# Patient Record
Sex: Female | Born: 1950 | Race: White | Hispanic: No | Marital: Married | Smoking: Current every day smoker
Health system: Southern US, Community
[De-identification: ages and names within clinical notes are randomized; demographics above are authoritative.]

## PROBLEM LIST (undated history)

## (undated) DIAGNOSIS — F329 Major depressive disorder, single episode, unspecified: Secondary | ICD-10-CM

## (undated) DIAGNOSIS — D649 Anemia, unspecified: Secondary | ICD-10-CM

## (undated) DIAGNOSIS — M545 Low back pain, unspecified: Secondary | ICD-10-CM

## (undated) DIAGNOSIS — K219 Gastro-esophageal reflux disease without esophagitis: Secondary | ICD-10-CM

## (undated) DIAGNOSIS — E042 Nontoxic multinodular goiter: Secondary | ICD-10-CM

## (undated) DIAGNOSIS — I728 Aneurysm of other specified arteries: Secondary | ICD-10-CM

## (undated) DIAGNOSIS — J449 Chronic obstructive pulmonary disease, unspecified: Secondary | ICD-10-CM

## (undated) DIAGNOSIS — I10 Essential (primary) hypertension: Secondary | ICD-10-CM

## (undated) DIAGNOSIS — F32A Depression, unspecified: Secondary | ICD-10-CM

## (undated) DIAGNOSIS — S72009A Fracture of unspecified part of neck of unspecified femur, initial encounter for closed fracture: Secondary | ICD-10-CM

## (undated) DIAGNOSIS — Z87442 Personal history of urinary calculi: Secondary | ICD-10-CM

## (undated) DIAGNOSIS — K759 Inflammatory liver disease, unspecified: Secondary | ICD-10-CM

## (undated) DIAGNOSIS — S129XXA Fracture of neck, unspecified, initial encounter: Secondary | ICD-10-CM

## (undated) DIAGNOSIS — M109 Gout, unspecified: Secondary | ICD-10-CM

## (undated) DIAGNOSIS — Z8719 Personal history of other diseases of the digestive system: Secondary | ICD-10-CM

## (undated) DIAGNOSIS — C801 Malignant (primary) neoplasm, unspecified: Secondary | ICD-10-CM

## (undated) DIAGNOSIS — F419 Anxiety disorder, unspecified: Secondary | ICD-10-CM

## (undated) DIAGNOSIS — K769 Liver disease, unspecified: Secondary | ICD-10-CM

## (undated) DIAGNOSIS — K2970 Gastritis, unspecified, without bleeding: Secondary | ICD-10-CM

## (undated) DIAGNOSIS — M549 Dorsalgia, unspecified: Secondary | ICD-10-CM

## (undated) DIAGNOSIS — K589 Irritable bowel syndrome without diarrhea: Secondary | ICD-10-CM

## (undated) HISTORY — PX: HERNIA REPAIR: SHX51

## (undated) HISTORY — PX: THYROID SURGERY: SHX805

## (undated) HISTORY — PX: BREAST CYST ASPIRATION: SHX578

## (undated) HISTORY — PX: CHOLECYSTECTOMY: SHX55

## (undated) HISTORY — PX: CARPAL TUNNEL RELEASE: SHX101

## (undated) HISTORY — PX: TONSILLECTOMY: SUR1361

## (undated) HISTORY — PX: APPENDECTOMY: SHX54

## (undated) HISTORY — PX: TUBAL LIGATION: SHX77

---
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
## 1976-10-19 HISTORY — PX: CHOLECYSTECTOMY: SHX55

## 2004-10-29 ENCOUNTER — Ambulatory Visit: Payer: Self-pay | Admitting: Unknown Physician Specialty

## 2005-03-30 ENCOUNTER — Ambulatory Visit: Payer: Self-pay | Admitting: General Practice

## 2005-04-08 ENCOUNTER — Ambulatory Visit: Payer: Self-pay | Admitting: General Practice

## 2006-03-30 ENCOUNTER — Ambulatory Visit: Payer: Self-pay | Admitting: General Practice

## 2007-04-05 ENCOUNTER — Ambulatory Visit: Payer: Self-pay | Admitting: Endocrinology

## 2008-01-13 ENCOUNTER — Ambulatory Visit: Payer: Self-pay | Admitting: Unknown Physician Specialty

## 2008-02-09 ENCOUNTER — Ambulatory Visit: Payer: Self-pay | Admitting: Unknown Physician Specialty

## 2008-05-30 ENCOUNTER — Ambulatory Visit: Payer: Self-pay | Admitting: Endocrinology

## 2008-08-16 ENCOUNTER — Ambulatory Visit: Payer: Self-pay | Admitting: Internal Medicine

## 2008-09-20 ENCOUNTER — Emergency Department: Payer: Self-pay | Admitting: Unknown Physician Specialty

## 2009-01-26 ENCOUNTER — Emergency Department: Payer: Self-pay | Admitting: Emergency Medicine

## 2009-02-22 ENCOUNTER — Ambulatory Visit: Payer: Self-pay

## 2010-02-19 ENCOUNTER — Ambulatory Visit: Payer: Self-pay | Admitting: Internal Medicine

## 2011-10-20 DIAGNOSIS — K759 Inflammatory liver disease, unspecified: Secondary | ICD-10-CM

## 2011-10-20 HISTORY — DX: Inflammatory liver disease, unspecified: K75.9

## 2011-11-01 ENCOUNTER — Ambulatory Visit: Payer: Self-pay

## 2011-11-19 ENCOUNTER — Ambulatory Visit: Payer: Self-pay | Admitting: Gastroenterology

## 2011-12-01 ENCOUNTER — Ambulatory Visit: Payer: Self-pay | Admitting: Gastroenterology

## 2011-12-01 LAB — PLATELET COUNT: Platelet: 267 10*3/uL (ref 150–440)

## 2011-12-01 LAB — PROTIME-INR: INR: 0.9

## 2011-12-03 LAB — PATHOLOGY REPORT

## 2012-01-04 ENCOUNTER — Ambulatory Visit: Payer: Self-pay

## 2012-01-20 ENCOUNTER — Other Ambulatory Visit: Payer: Self-pay | Admitting: Gastroenterology

## 2012-01-21 LAB — CLOSTRIDIUM DIFFICILE BY PCR

## 2012-01-22 LAB — STOOL CULTURE

## 2012-03-22 ENCOUNTER — Ambulatory Visit: Payer: Self-pay | Admitting: Internal Medicine

## 2012-03-22 LAB — CBC CANCER CENTER
HGB: 8.9 g/dL — ABNORMAL LOW (ref 12.0–16.0)
MCHC: 33.2 g/dL (ref 32.0–36.0)
Monocytes: 5 %
Platelet: 112 x10 3/mm — ABNORMAL LOW (ref 150–440)
Segmented Neutrophils: 73 %

## 2012-03-22 LAB — IRON AND TIBC
Iron Bind.Cap.(Total): 397 ug/dL (ref 250–450)
Iron Saturation: 39 %

## 2012-03-22 LAB — FOLATE: Folic Acid: 9.4 ng/mL (ref 3.1–100.0)

## 2012-03-22 LAB — RETICULOCYTES: Absolute Retic Count: 0.0474 10*6/uL (ref 0.024–0.084)

## 2012-03-22 LAB — FERRITIN: Ferritin (ARMC): 569 ng/mL — ABNORMAL HIGH (ref 8–388)

## 2012-03-23 LAB — URINE IEP, RANDOM

## 2012-03-23 LAB — PROT IMMUNOELECTROPHORES(ARMC)

## 2012-03-24 LAB — OCCULT BLOOD X 1 CARD TO LAB, STOOL: Occult Blood, Feces: NEGATIVE

## 2012-04-07 LAB — CBC CANCER CENTER
Basophil #: 0 x10 3/mm (ref 0.0–0.1)
Basophil %: 0.4 %
Eosinophil #: 0 x10 3/mm (ref 0.0–0.7)
HCT: 20.6 % — ABNORMAL LOW (ref 35.0–47.0)
HGB: 6.9 g/dL — ABNORMAL LOW (ref 12.0–16.0)
Lymphocyte #: 0.6 x10 3/mm — ABNORMAL LOW (ref 1.0–3.6)
Lymphocyte %: 30.6 %
MCV: 102 fL — ABNORMAL HIGH (ref 80–100)
Monocyte #: 0.2 x10 3/mm (ref 0.2–0.9)
Neutrophil #: 1.3 x10 3/mm — ABNORMAL LOW (ref 1.4–6.5)
Neutrophil %: 60.3 %
RBC: 2.02 10*6/uL — ABNORMAL LOW (ref 3.80–5.20)
RDW: 18.1 % — ABNORMAL HIGH (ref 11.5–14.5)

## 2012-04-08 ENCOUNTER — Ambulatory Visit: Payer: Self-pay | Admitting: Oncology

## 2012-04-11 LAB — CBC CANCER CENTER
Basophil #: 0 x10 3/mm (ref 0.0–0.1)
Eosinophil #: 0 x10 3/mm (ref 0.0–0.7)
HCT: 26.6 % — ABNORMAL LOW (ref 35.0–47.0)
Lymphocyte #: 0.6 x10 3/mm — ABNORMAL LOW (ref 1.0–3.6)
Lymphocyte %: 27.3 %
MCH: 34.2 pg — ABNORMAL HIGH (ref 26.0–34.0)
MCV: 102 fL — ABNORMAL HIGH (ref 80–100)
Monocyte #: 0.2 x10 3/mm (ref 0.2–0.9)
Monocyte %: 11.2 %
Neutrophil %: 60.5 %
Platelet: 102 x10 3/mm — ABNORMAL LOW (ref 150–440)
RDW: 17.3 % — ABNORMAL HIGH (ref 11.5–14.5)
WBC: 2.1 x10 3/mm — ABNORMAL LOW (ref 3.6–11.0)

## 2012-04-14 LAB — CBC CANCER CENTER
Basophil #: 0 x10 3/mm (ref 0.0–0.1)
Basophil %: 0.6 %
Eosinophil #: 0 x10 3/mm (ref 0.0–0.7)
Eosinophil %: 0.5 %
Lymphocyte #: 0.7 x10 3/mm — ABNORMAL LOW (ref 1.0–3.6)
Lymphocyte %: 26.2 %
MCHC: 33.4 g/dL (ref 32.0–36.0)
MCV: 102 fL — ABNORMAL HIGH (ref 80–100)
Monocyte #: 0.3 x10 3/mm (ref 0.2–0.9)
Neutrophil #: 1.5 x10 3/mm (ref 1.4–6.5)
Platelet: 121 x10 3/mm — ABNORMAL LOW (ref 150–440)
RBC: 2.63 10*6/uL — ABNORMAL LOW (ref 3.80–5.20)
RDW: 17.3 % — ABNORMAL HIGH (ref 11.5–14.5)

## 2012-04-18 ENCOUNTER — Ambulatory Visit: Payer: Self-pay | Admitting: Internal Medicine

## 2012-05-19 ENCOUNTER — Ambulatory Visit: Payer: Self-pay | Admitting: Internal Medicine

## 2012-06-19 ENCOUNTER — Ambulatory Visit: Payer: Self-pay | Admitting: Internal Medicine

## 2012-11-04 ENCOUNTER — Ambulatory Visit: Payer: Self-pay | Admitting: Gastroenterology

## 2012-11-09 ENCOUNTER — Ambulatory Visit: Payer: Self-pay | Admitting: Gastroenterology

## 2012-11-11 LAB — PATHOLOGY REPORT

## 2013-10-19 HISTORY — PX: THYROID SURGERY: SHX805

## 2013-12-19 ENCOUNTER — Ambulatory Visit: Payer: Self-pay | Admitting: Surgery

## 2013-12-28 ENCOUNTER — Other Ambulatory Visit: Payer: Self-pay | Admitting: Physician Assistant

## 2013-12-28 ENCOUNTER — Ambulatory Visit: Payer: Self-pay | Admitting: Surgery

## 2013-12-28 LAB — CBC WITH DIFFERENTIAL/PLATELET
Basophil #: 0.1 10*3/uL (ref 0.0–0.1)
Basophil %: 0.8 %
EOS ABS: 0.1 10*3/uL (ref 0.0–0.7)
Eosinophil %: 1.3 %
HCT: 43.1 % (ref 35.0–47.0)
HGB: 15.1 g/dL (ref 12.0–16.0)
LYMPHS PCT: 21.4 %
Lymphocyte #: 1.7 10*3/uL (ref 1.0–3.6)
MCH: 32.6 pg (ref 26.0–34.0)
MCHC: 34.9 g/dL (ref 32.0–36.0)
MCV: 93 fL (ref 80–100)
Monocyte #: 0.6 x10 3/mm (ref 0.2–0.9)
Monocyte %: 7.1 %
NEUTROS ABS: 5.7 10*3/uL (ref 1.4–6.5)
Neutrophil %: 69.4 %
Platelet: 293 10*3/uL (ref 150–440)
RBC: 4.62 10*6/uL (ref 3.80–5.20)
RDW: 13.8 % (ref 11.5–14.5)
WBC: 8.2 10*3/uL (ref 3.6–11.0)

## 2013-12-28 LAB — PROTIME-INR
INR: 1
PROTHROMBIN TIME: 13.3 s (ref 11.5–14.7)

## 2013-12-28 LAB — TSH: THYROID STIMULATING HORM: 0.772 u[IU]/mL

## 2013-12-28 LAB — BASIC METABOLIC PANEL
Anion Gap: 1 — ABNORMAL LOW (ref 7–16)
BUN: 10 mg/dL (ref 7–18)
Calcium, Total: 9.2 mg/dL (ref 8.5–10.1)
Chloride: 109 mmol/L — ABNORMAL HIGH (ref 98–107)
Co2: 28 mmol/L (ref 21–32)
Creatinine: 0.84 mg/dL (ref 0.60–1.30)
GLUCOSE: 84 mg/dL (ref 65–99)
Osmolality: 274 (ref 275–301)
Potassium: 3.8 mmol/L (ref 3.5–5.1)
SODIUM: 138 mmol/L (ref 136–145)

## 2013-12-28 LAB — LIPID PANEL
CHOLESTEROL: 167 mg/dL (ref 0–200)
HDL Cholesterol: 34 mg/dL — ABNORMAL LOW (ref 40–60)
LDL CHOLESTEROL, CALC: 101 mg/dL — AB (ref 0–100)
Triglycerides: 158 mg/dL (ref 0–200)
VLDL Cholesterol, Calc: 32 mg/dL (ref 5–40)

## 2013-12-28 LAB — APTT: ACTIVATED PTT: 37.7 s — AB (ref 23.6–35.9)

## 2014-01-05 ENCOUNTER — Ambulatory Visit: Payer: Self-pay | Admitting: Surgery

## 2014-01-16 LAB — PATHOLOGY REPORT

## 2014-04-04 ENCOUNTER — Ambulatory Visit: Payer: Self-pay | Admitting: Physician Assistant

## 2014-07-20 ENCOUNTER — Ambulatory Visit: Payer: Self-pay

## 2015-02-09 NOTE — Op Note (Signed)
PATIENT NAME:  Tina Bradley, Tina Bradley MR#:  [id number] DATE OF BIRTH:  06/12/1951  DATE OF PROCEDURE:  01/05/2014  PREOPERATIVE DIAGNOSIS: Multiple colloid nodules, right lobe thyroid gland, with dysphagia.   POSTOPERATIVE  DIAGNOSIS: Multiple colloid nodules, right lobe thyroid gland, with dysphagia.   PROCEDURE: Right thyroid lobectomy.   SURGEON: Loreli Dollar, MD  ANESTHESIA: General.   INDICATIONS: This 64 year old female has a complaint of difficulty swallowing. She has had endocrinology evaluation and has had findings of 3 hypoechoic nodules of the right lobe thyroid gland. Biopsy demonstrated colloid nodule. Surgery was recommended for definitive treatment.   DESCRIPTION OF PROCEDURE: The patient was placed on the operating table in the supine position under general endotracheal anesthesia. The rolled sheet was placed behind the shoulder blades so that the neck was extended. The neck and surrounding chest wall were prepared with ChloraPrep and draped in a sterile manner.   A transversely oriented curvilinear incision was made, carried down through subcutaneous tissues. Several small bleeding points were cauterized. The platysma was incised with electrocautery. The midline was identified. The small incision was made in the midline fascia and dissected down to identify the strap muscles. The sternocleidomastoid was separated from the strap muscles laterally. The strap muscles on the right side were divided with the Harmonic scalpel as they were dissected off the underlying thyroid lobe. Also, just about 1 cm width of strap muscles was divided on the left side adjacent to the midline. Next, the strap muscles were dissected away from the thyroid towards the superior pole and also inferiorly. A Weitlaner retractor was used. The thyroid was grasped with a hemostat for traction. Next, with further dissection, the thyroid was mobilized. The superior pole vessels were dissected circumferentially and were  ligated with 3-0 Vicryl, and then the superior pole vessels were divided with the Harmonic scalpel. Subsequently, the inferior thyroidal artery was divided with the Harmonic scalpel. The thyroid was further mobilized. Identified right inferior parathyroid gland which appeared to be normal size. Also, the location of the recurrent laryngeal nerve was demonstrated. Further dissection was carried out and noted the presence of the right superior parathyroid gland which appeared normal size. The gland was further dissected with a combination of blunt dissection and use of Harmonic scalpel, and divided the thyroid at the isthmus and was submitted in formalin for routine pathology. The wound was inspected, and hemostasis appeared to be intact. The patient was momentarily placed in the reverse Trendelenburg position, and there was no bleeding, then returned to reverse Trendelenburg position, and after a brief period of observation, seeing hemostasis was intact, the strap muscles were repaired with interrupted 4-0 Vicryl simple and figure-of-eight sutures. Next, the platysma was closed with interrupted 5-0 Monocryl, and the skin was closed with running 5-0 Monocryl subcuticular suture and Dermabond. The patient tolerated surgery satisfactorily and was then prepared for transfer to the recovery room.  ____________________________ Lenna Sciara. Rochel Brome, MD jws:lb D: 01/05/2014 09:56:23 ET T: 01/05/2014 10:05:09 ET JOB#: [id number]  cc: Loreli Dollar, MD, <Dictator> Loreli Dollar MD ELECTRONICALLY SIGNED 01/12/2014 18:40

## 2015-03-26 ENCOUNTER — Other Ambulatory Visit: Payer: Self-pay | Admitting: Physical Medicine and Rehabilitation

## 2015-03-26 DIAGNOSIS — M5412 Radiculopathy, cervical region: Secondary | ICD-10-CM

## 2015-04-01 ENCOUNTER — Ambulatory Visit
Admission: RE | Admit: 2015-04-01 | Discharge: 2015-04-01 | Disposition: A | Discharge status: Home / Self Care | Payer: BLUE CROSS/BLUE SHIELD | Source: Ambulatory Visit | Attending: Physical Medicine and Rehabilitation | Admitting: Physical Medicine and Rehabilitation

## 2015-04-01 DIAGNOSIS — M5030 Other cervical disc degeneration, unspecified cervical region: Secondary | ICD-10-CM | POA: Insufficient documentation

## 2015-04-01 DIAGNOSIS — M5412 Radiculopathy, cervical region: Secondary | ICD-10-CM

## 2015-04-01 DIAGNOSIS — M5382 Other specified dorsopathies, cervical region: Secondary | ICD-10-CM | POA: Diagnosis not present

## 2015-04-01 DIAGNOSIS — M47892 Other spondylosis, cervical region: Secondary | ICD-10-CM | POA: Insufficient documentation

## 2015-04-01 DIAGNOSIS — M4802 Spinal stenosis, cervical region: Secondary | ICD-10-CM | POA: Diagnosis not present

## 2015-04-01 DIAGNOSIS — M542 Cervicalgia: Secondary | ICD-10-CM | POA: Diagnosis present

## 2015-05-09 ENCOUNTER — Other Ambulatory Visit: Payer: Self-pay | Admitting: Vascular Surgery

## 2015-05-09 DIAGNOSIS — I728 Aneurysm of other specified arteries: Secondary | ICD-10-CM

## 2015-05-16 ENCOUNTER — Other Ambulatory Visit: Payer: Self-pay | Admitting: Vascular Surgery

## 2015-05-16 DIAGNOSIS — I728 Aneurysm of other specified arteries: Secondary | ICD-10-CM

## 2015-05-21 ENCOUNTER — Ambulatory Visit
Admission: RE | Admit: 2015-05-21 | Discharge: 2015-05-21 | Disposition: A | Discharge status: Home / Self Care | Payer: BLUE CROSS/BLUE SHIELD | Source: Ambulatory Visit | Attending: Vascular Surgery | Admitting: Vascular Surgery

## 2015-05-21 DIAGNOSIS — I728 Aneurysm of other specified arteries: Secondary | ICD-10-CM

## 2015-06-03 ENCOUNTER — Emergency Department
Admission: EM | Admit: 2015-06-03 | Discharge: 2015-06-03 | Disposition: A | Discharge status: Home / Self Care | Payer: BLUE CROSS/BLUE SHIELD | Attending: Emergency Medicine | Admitting: Emergency Medicine

## 2015-06-03 ENCOUNTER — Encounter: Payer: Self-pay | Admitting: Emergency Medicine

## 2015-06-03 DIAGNOSIS — Z720 Tobacco use: Secondary | ICD-10-CM | POA: Diagnosis not present

## 2015-06-03 DIAGNOSIS — M545 Low back pain: Secondary | ICD-10-CM | POA: Insufficient documentation

## 2015-06-03 DIAGNOSIS — G8929 Other chronic pain: Secondary | ICD-10-CM

## 2015-06-03 DIAGNOSIS — M549 Dorsalgia, unspecified: Secondary | ICD-10-CM

## 2015-06-03 DIAGNOSIS — M542 Cervicalgia: Secondary | ICD-10-CM | POA: Insufficient documentation

## 2015-06-03 DIAGNOSIS — G8921 Chronic pain due to trauma: Secondary | ICD-10-CM | POA: Insufficient documentation

## 2015-06-03 HISTORY — DX: Dorsalgia, unspecified: M54.9

## 2015-06-03 MED ORDER — DEXAMETHASONE SODIUM PHOSPHATE 10 MG/ML IJ SOLN
10.0000 mg | Freq: Once | INTRAMUSCULAR | Status: AC
  Administered 2015-06-03: 10 mg via INTRAMUSCULAR
  Filled 2015-06-03: qty 1

## 2015-06-03 MED ORDER — ORPHENADRINE CITRATE 30 MG/ML IJ SOLN
60.0000 mg | Freq: Two times a day (BID) | INTRAMUSCULAR | Status: DC
  Administered 2015-06-03: 60 mg via INTRAMUSCULAR
  Filled 2015-06-03 (×2): qty 2

## 2015-06-03 MED ORDER — HYDROMORPHONE HCL 1 MG/ML IJ SOLN
1.0000 mg | Freq: Once | INTRAMUSCULAR | Status: AC
  Administered 2015-06-03: 1 mg via INTRAMUSCULAR
  Filled 2015-06-03: qty 1

## 2015-06-03 NOTE — ED Notes (Signed)
Says pain in low back and cant see her doc right away. Needs pain control

## 2015-06-03 NOTE — Discharge Instructions (Signed)
Advised to follow up with Pain Management clinic.

## 2015-06-03 NOTE — ED Provider Notes (Signed)
[facility] Emergency Department Provider Note  ____________________________________________  Time seen: Approximately 11:55 AM  I have reviewed the triage vital signs and the nursing notes.   HISTORY  Chief Complaint Back Pain    HPI Tina Bradley is a 64 y.o. female patient here for complaint of chronic back pain. Patient states she is low back pain and see her pain management doctor for a while. He states she is currently in physical therapy for her neck pain but he has not a trauma back pain at this time. Patient prescription outpatient prescriptions for pain medication. Patient her back pain is secondary to a fall which happened 6 years ago.Patient is rating her pain as a 10 over 10 described as sharp. Patient denies any radicular component to this pain. Patient denies any bladder or bowel dysfunction. Patient stated this and no palliative measures taken for this complaint.   Past Medical History  Diagnosis Date  . Back pain     There are no active problems to display for this patient.   No past surgical history on file.  No current outpatient prescriptions on file.  Allergies Augmentin and Darvon  No family history on file.  Social History Social History  Substance Use Topics  . Smoking status: Current Every Day Smoker  . Smokeless tobacco: None  . Alcohol Use: No    Review of Systems Constitutional: No fever/chills Eyes: No visual changes. ENT: No sore throat. Cardiovascular: Denies chest pain. Respiratory: Denies shortness of breath. Gastrointestinal: No abdominal pain.  No nausea, no vomiting.  No diarrhea.  No constipation. Genitourinary: Negative for dysuria. Musculoskeletal: Positive chronic back pain.  Skin: Negative for rash. Neurological: Negative for headaches, focal weakness or numbness. Allergic/Immunilogical: See medication list.  10-point ROS otherwise  negative.  ____________________________________________   PHYSICAL EXAM:  VITAL SIGNS: ED Triage Vitals  Enc Vitals Group     BP 06/03/15 1102 140/72 mmHg     Pulse Rate 06/03/15 1102 102     Resp 06/03/15 1102 14     Temp 06/03/15 1102 97.5 F (36.4 C)     Temp Source 06/03/15 1102 Oral     SpO2 06/03/15 1102 100 %     Weight --      Height --      Head Cir --      Peak Flow --      Pain Score 06/03/15 1102 10     Pain Loc --      Pain Edu? --      Excl. in [place]? --     Constitutional: Alert and oriented. Well appearing and in no acute distress. Eyes: Conjunctivae are normal. PERRL. EOMI. Head: Atraumatic. Nose: No congestion/rhinnorhea. Mouth/Throat: Mucous membranes are moist.  Oropharynx non-erythematous. Neck: No stridor.  No cervical spine tenderness to palpation. Hematological/Lymphatic/Immunilogical: No cervical lymphadenopathy. Cardiovascular: Normal rate, regular rhythm. Grossly normal heart sounds.  Good peripheral circulation. Respiratory: Normal respiratory effort.  No retractions. Lungs CTAB. Gastrointestinal: Soft and nontender. No distention. No abdominal bruits. No CVA tenderness. Musculoskeletal: No spinal deformity. No CVA gotten. Patient has some moderate guarding palpation L3-S1. Patient decreased range of motion with extension and flexion. Lateral motions are full and equal. Patient has a negative straight leg test.  Neurologic:  Normal speech and language. No gross focal neurologic deficits are appreciated. No gait instability. Skin:  Skin is warm, dry and intact. No rash noted. Psychiatric: Mood and affect are normal. Speech and behavior are normal.  ____________________________________________  LABS (all labs ordered are listed, but only abnormal results are displayed)  Labs Reviewed - No data to  display ____________________________________________  EKG   ____________________________________________  RADIOLOGY   ____________________________________________   PROCEDURES  Procedure(s) performed: None  Critical Care performed: No  ____________________________________________   INITIAL IMPRESSION / ASSESSMENT AND PLAN / ED COURSE  Pertinent labs & imaging results that were available during my care of the patient were reviewed by me and considered in my medical decision making (see chart for details).  Chronic low back pain. Advised patient to contact the pain management doctor for outpatient pain medication. Patient was given Dilaudid, Norflex, and Decadron IM. ____________________________________________   FINAL CLINICAL IMPRESSION(S) / ED DIAGNOSES  Final diagnoses:  Chronic back pain greater than 3 months duration      Sable Feil, PA-C 06/03/15 1201  Earleen Newport, MD 06/05/15 [phone]

## 2015-06-03 NOTE — ED Notes (Signed)
Has chronic back pain,

## 2015-06-03 NOTE — ED Provider Notes (Deleted)
I, Sable Feil, personally viewed and evaluated these images (plain radiographs) as part of my medical decision making.    Sable Feil, PA-C 06/03/15 1154

## 2015-06-13 ENCOUNTER — Encounter: Payer: Self-pay | Admitting: Emergency Medicine

## 2015-06-13 ENCOUNTER — Ambulatory Visit
Admission: EM | Admit: 2015-06-13 | Discharge: 2015-06-13 | Disposition: A | Discharge status: Home / Self Care | Payer: BLUE CROSS/BLUE SHIELD | Attending: Family Medicine | Admitting: Family Medicine

## 2015-06-13 DIAGNOSIS — M62830 Muscle spasm of back: Secondary | ICD-10-CM | POA: Diagnosis not present

## 2015-06-13 DIAGNOSIS — M461 Sacroiliitis, not elsewhere classified: Secondary | ICD-10-CM

## 2015-06-13 HISTORY — DX: Essential (primary) hypertension: I10

## 2015-06-13 HISTORY — DX: Gastro-esophageal reflux disease without esophagitis: K21.9

## 2015-06-13 MED ORDER — NAPROXEN 500 MG PO TABS: 500.0000 mg | ORAL_TABLET | Freq: Two times a day (BID) | ORAL | Status: DC

## 2015-06-13 MED ORDER — KETOROLAC TROMETHAMINE 60 MG/2ML IM SOLN
60.0000 mg | Freq: Once | INTRAMUSCULAR | Status: AC
  Administered 2015-06-13: 60 mg via INTRAMUSCULAR

## 2015-06-13 MED ORDER — METAXALONE 800 MG PO TABS: 800.0000 mg | ORAL_TABLET | Freq: Three times a day (TID) | ORAL | Status: DC

## 2015-06-13 NOTE — ED Notes (Signed)
Patient c/o lower back pain for the past 2 months but has gotten worse over the past 3 weeks.

## 2015-06-13 NOTE — Discharge Instructions (Signed)
Back Pain, Adult °Back pain is very common. The pain often gets better over time. The cause of back pain is usually not dangerous. Most people can learn to manage their back pain on their own.  °HOME CARE  °· Stay active. Start with short walks on flat ground if you can. Try to walk farther each day. °· Do not sit, drive, or stand in one place for more than 30 minutes. Do not stay in bed. °· Do not avoid exercise or work. Activity can help your back heal faster. °· Be careful when you bend or lift an object. Bend at your knees, keep the object close to you, and do not twist. °· Sleep on a firm mattress. Lie on your side, and bend your knees. If you lie on your back, put a pillow under your knees. °· Only take medicines as told by your doctor. °· Put ice on the injured area. °¨ Put ice in a plastic bag. °¨ Place a towel between your skin and the bag. °¨ Leave the ice on for 15-20 minutes, 03-04 times a day for the first 2 to 3 days. After that, you can switch between ice and heat packs. °· Ask your doctor about back exercises or massage. °· Avoid feeling anxious or stressed. Find good ways to deal with stress, such as exercise. °GET HELP RIGHT AWAY IF:  °· Your pain does not go away with rest or medicine. °· Your pain does not go away in 1 week. °· You have new problems. °· You do not feel well. °· The pain spreads into your legs. °· You cannot control when you poop (bowel movement) or pee (urinate). °· Your arms or legs feel weak or lose feeling (numbness). °· You feel sick to your stomach (nauseous) or throw up (vomit). °· You have belly (abdominal) pain. °· You feel like you may pass out (faint). °MAKE SURE YOU:  °· Understand these instructions. °· Will watch your condition. °· Will get help right away if you are not doing well or get worse. °Document Released: 03/23/2008 Document Revised: 12/28/2011 Document Reviewed: 02/06/2014 °[employer]® Patient Information ©2015 [employer]. This information is not intended  to replace advice given to you by your health care provider. Make sure you discuss any questions you have with your health care provider. ° °

## 2015-06-13 NOTE — ED Provider Notes (Signed)
CSN: [id number]     Arrival date & time 06/13/15  1027 History   First MD Initiated Contact with Patient 06/13/15 1053     Chief Complaint  Patient presents with  . Back Pain   (Consider location/radiation/quality/duration/timing/severity/associated sxs/prior Treatment) HPI  This is a 64 year old female who presents with a three-week history of low back pain that occasionally will radiate into her left buttock and into her feet. She complains that her feet will draw up even preventing her from putting on her flip-flops. She has recently been in physical therapy for her neck pain which helped but now her back pain is flared up without a cause. She has a known disc bulge. She has worked at [employer] in the past,but recently has not been able to work because of her back pain. She states that she was seen in the emergency room about one week ago and they told her they could not treat her with pain medicine since she was in the pain management program which she denies today. Does state that she has an appointment with a pain management specialist at a clinic in the middle of September. I checked her on the [place] substance abuse registry and she has been receiving numerous prescriptions for Xanax and recently received a 30 day supply of tramadol on 06/03/2015. The signature on that was for twice a day. Denies any incontinence. She is constantly moving from sitting to standing and walking around during the entire interview.  Past Medical History  Diagnosis Date  . Back pain   . Hypertension   . GERD (gastroesophageal reflux disease)    Past Surgical History  Procedure Laterality Date  . Thyroid surgery    . Tonsillectomy    . Appendectomy    . Cholecystectomy     History reviewed. No pertinent family history. Social History  Substance Use Topics  . Smoking status: Current Every Day Smoker -- 0.50 packs/day    Types: Cigarettes  . Smokeless tobacco: None  . Alcohol Use: No   OB  History    No data available     Review of Systems  Constitutional: Positive for activity change.  Musculoskeletal: Positive for myalgias, back pain, gait problem and neck pain.  All other systems reviewed and are negative.   Allergies  Augmentin and Darvon  Home Medications   Prior to Admission medications   Medication Sig Start Date End Date Taking? Authorizing Provider  ALPRAZolam Duanne Moron) 0.5 MG tablet Take 0.5 mg by mouth at bedtime as needed for anxiety.   Yes Historical Provider, MD  amitriptyline (ELAVIL) 75 MG tablet Take 75 mg by mouth at bedtime.   Yes Historical Provider, MD  metoprolol succinate (TOPROL-XL) 50 MG 24 hr tablet Take 50 mg by mouth daily. Take with or immediately following a meal.   Yes Historical Provider, MD  omeprazole (PRILOSEC) 40 MG capsule Take 80 mg by mouth daily.   Yes Historical Provider, MD  metaxalone (SKELAXIN) 800 MG tablet Take 1 tablet (800 mg total) by mouth 3 (three) times daily. 06/13/15   Lorin Picket, PA-C  naproxen (NAPROSYN) 500 MG tablet Take 1 tablet (500 mg total) by mouth 2 (two) times daily with a meal. 06/13/15   Lorin Picket, PA-C   BP 132/76 mmHg  Pulse 69  Temp(Src) 96.3 F (35.7 C) (Tympanic)  Resp 16  Ht 5\' 3"  (1.6 m)  Wt 128 lb (58.06 kg)  BMI 22.68 kg/m2  SpO2 100% Physical Exam  Constitutional: She is oriented to person, place, and time. She appears well-developed and well-nourished.  HENT:  Head: Normocephalic and atraumatic.  Eyes: Pupils are equal, round, and reactive to light. Right eye exhibits no discharge. Left eye exhibits no discharge.  Neck: Normal range of motion. Neck supple.  Musculoskeletal:  Examination of the lumbar spine was performed with Nira Conn, RN chaperone. The patient's pelvis is level in stance. Lateral flexion is painful on both left and right lateral flexion with some blunting of the lower lumbar segments to the left. Is tenderness palpation in the left paraspinous muscles. For  flexion is accomplished fingertips to her ankle level but when she stands erect difficulty with maintaining her posture until she is fully erect. She is able to toe and heel walk adequately. Deep tendon reflexes are 2+ over 4 at the knee and ankle joints. There is no clonus present. Sensation is intact to light touch throughout. EHL peroneal and anterior tibialis are strong to clinical testing. Her leg raise testing is positive at 90 on the left with the posterior thigh pulling sensation and negative on the right at 90. With recumbency the sacroiliac joint on the left is most tender as is the paraspinous muscles in the lower segments at the L4-L5 levels bilaterally.  Neurological: She is alert and oriented to person, place, and time.  Skin: Skin is warm and dry.  Psychiatric: She has a normal mood and affect. Her behavior is normal. Judgment and thought content normal.  Nursing note and vitals reviewed.   ED Course  Procedures (including critical care time) Labs Review Labs Reviewed - No data to display  Imaging Review No results found. 11:35 Medication Given HM  ketorolac (TORADOL) injection 60 mg - Dose: 60 mg ; Route: Intramuscular ; Site: Left Ventrogluteal ; Scheduled Time: 1423       MDM   1. Spasm of lumbar paraspinous muscle   2. Sacroiliitis    New Prescriptions   METAXALONE (SKELAXIN) 800 MG TABLET    Take 1 tablet (800 mg total) by mouth 3 (three) times daily.   NAPROXEN (NAPROSYN) 500 MG TABLET    Take 1 tablet (500 mg total) by mouth 2 (two) times daily with a meal.  Plan: 1. Diagnosis reviewed with patient 2. rx as per orders; risks, benefits, potential side effects reviewed with patient 3. Recommend supportive treatment with protection rest and heat on a when necessary basis 4. F/u prn if symptoms worsen or don't improve. I've advised her that I do not use narcotics and treating back pain. I am more than happy to prescribe some muscle relaxers and anti-inflammatory  medications. I offered her a shot of Toradol 60 mg intramuscular which she received in the clinic. She'll follow-up with her pain management specialist in September at the [facility]. Tortuous highly important to avoid symptoms as much as possible and have recommended that she did lie down or walk around to stay in bed for any length of time. She should avoid sitting lifting and bending.     Lorin Picket, PA-C 06/13/15 1152

## 2015-06-26 ENCOUNTER — Other Ambulatory Visit: Payer: Self-pay | Admitting: Physical Medicine and Rehabilitation

## 2015-06-26 DIAGNOSIS — M5416 Radiculopathy, lumbar region: Secondary | ICD-10-CM

## 2015-07-02 ENCOUNTER — Ambulatory Visit
Admission: RE | Admit: 2015-07-02 | Discharge: 2015-07-02 | Disposition: A | Discharge status: Home / Self Care | Payer: BLUE CROSS/BLUE SHIELD | Source: Ambulatory Visit | Attending: Physical Medicine and Rehabilitation | Admitting: Physical Medicine and Rehabilitation

## 2015-07-02 DIAGNOSIS — M5126 Other intervertebral disc displacement, lumbar region: Secondary | ICD-10-CM | POA: Insufficient documentation

## 2015-07-02 DIAGNOSIS — M5416 Radiculopathy, lumbar region: Secondary | ICD-10-CM | POA: Diagnosis present

## 2016-01-09 ENCOUNTER — Other Ambulatory Visit: Payer: Self-pay | Admitting: Infectious Diseases

## 2016-01-09 DIAGNOSIS — R634 Abnormal weight loss: Secondary | ICD-10-CM

## 2016-01-16 ENCOUNTER — Ambulatory Visit
Admission: RE | Admit: 2016-01-16 | Discharge: 2016-01-16 | Disposition: A | Discharge status: Home / Self Care | Payer: BLUE CROSS/BLUE SHIELD | Source: Ambulatory Visit | Attending: Infectious Diseases | Admitting: Infectious Diseases

## 2016-01-16 ENCOUNTER — Ambulatory Visit: Admission: RE | Admit: 2016-01-16 | Payer: BLUE CROSS/BLUE SHIELD | Source: Ambulatory Visit

## 2016-01-16 DIAGNOSIS — R918 Other nonspecific abnormal finding of lung field: Secondary | ICD-10-CM | POA: Insufficient documentation

## 2016-01-16 DIAGNOSIS — E041 Nontoxic single thyroid nodule: Secondary | ICD-10-CM | POA: Insufficient documentation

## 2016-01-16 DIAGNOSIS — R634 Abnormal weight loss: Secondary | ICD-10-CM | POA: Diagnosis present

## 2016-01-16 DIAGNOSIS — I728 Aneurysm of other specified arteries: Secondary | ICD-10-CM | POA: Diagnosis not present

## 2016-02-10 ENCOUNTER — Other Ambulatory Visit: Payer: Self-pay | Admitting: Infectious Diseases

## 2016-02-10 DIAGNOSIS — Z1231 Encounter for screening mammogram for malignant neoplasm of breast: Secondary | ICD-10-CM

## 2016-02-18 ENCOUNTER — Ambulatory Visit
Admission: RE | Admit: 2016-02-18 | Discharge: 2016-02-18 | Disposition: A | Discharge status: Home / Self Care | Payer: BLUE CROSS/BLUE SHIELD | Source: Ambulatory Visit | Attending: Infectious Diseases | Admitting: Infectious Diseases

## 2016-02-18 DIAGNOSIS — Z1231 Encounter for screening mammogram for malignant neoplasm of breast: Secondary | ICD-10-CM | POA: Diagnosis present

## 2016-02-18 DIAGNOSIS — R921 Mammographic calcification found on diagnostic imaging of breast: Secondary | ICD-10-CM | POA: Diagnosis not present

## 2016-03-05 ENCOUNTER — Encounter: Payer: Self-pay | Admitting: *Deleted

## 2016-03-06 ENCOUNTER — Encounter
Admission: RE | Disposition: A | Discharge status: Home / Self Care | Payer: Self-pay | Source: Ambulatory Visit | Attending: Gastroenterology

## 2016-03-06 ENCOUNTER — Encounter: Payer: Self-pay | Admitting: *Deleted

## 2016-03-06 ENCOUNTER — Ambulatory Visit
Admission: RE | Admit: 2016-03-06 | Discharge: 2016-03-06 | Disposition: A | Discharge status: Home / Self Care | Payer: BLUE CROSS/BLUE SHIELD | Source: Ambulatory Visit | Attending: Gastroenterology | Admitting: Gastroenterology

## 2016-03-06 ENCOUNTER — Ambulatory Visit: Payer: BLUE CROSS/BLUE SHIELD | Admitting: Anesthesiology

## 2016-03-06 DIAGNOSIS — Z681 Body mass index (BMI) 19 or less, adult: Secondary | ICD-10-CM | POA: Insufficient documentation

## 2016-03-06 DIAGNOSIS — I10 Essential (primary) hypertension: Secondary | ICD-10-CM | POA: Insufficient documentation

## 2016-03-06 DIAGNOSIS — K219 Gastro-esophageal reflux disease without esophagitis: Secondary | ICD-10-CM | POA: Insufficient documentation

## 2016-03-06 DIAGNOSIS — K2970 Gastritis, unspecified, without bleeding: Secondary | ICD-10-CM | POA: Diagnosis not present

## 2016-03-06 DIAGNOSIS — R1013 Epigastric pain: Secondary | ICD-10-CM | POA: Insufficient documentation

## 2016-03-06 DIAGNOSIS — Z79899 Other long term (current) drug therapy: Secondary | ICD-10-CM | POA: Diagnosis not present

## 2016-03-06 DIAGNOSIS — R634 Abnormal weight loss: Secondary | ICD-10-CM | POA: Insufficient documentation

## 2016-03-06 DIAGNOSIS — Z79891 Long term (current) use of opiate analgesic: Secondary | ICD-10-CM | POA: Insufficient documentation

## 2016-03-06 DIAGNOSIS — Z791 Long term (current) use of non-steroidal anti-inflammatories (NSAID): Secondary | ICD-10-CM | POA: Diagnosis not present

## 2016-03-06 DIAGNOSIS — Z87891 Personal history of nicotine dependence: Secondary | ICD-10-CM | POA: Diagnosis not present

## 2016-03-06 DIAGNOSIS — K3189 Other diseases of stomach and duodenum: Secondary | ICD-10-CM | POA: Insufficient documentation

## 2016-03-06 DIAGNOSIS — R1310 Dysphagia, unspecified: Secondary | ICD-10-CM | POA: Diagnosis not present

## 2016-03-06 DIAGNOSIS — R112 Nausea with vomiting, unspecified: Secondary | ICD-10-CM | POA: Insufficient documentation

## 2016-03-06 DIAGNOSIS — Z7951 Long term (current) use of inhaled steroids: Secondary | ICD-10-CM | POA: Diagnosis not present

## 2016-03-06 HISTORY — DX: Irritable bowel syndrome, unspecified: K58.9

## 2016-03-06 HISTORY — DX: Nontoxic multinodular goiter: E04.2

## 2016-03-06 HISTORY — DX: Anemia, unspecified: D64.9

## 2016-03-06 HISTORY — DX: Gout, unspecified: M10.9

## 2016-03-06 HISTORY — DX: Personal history of other diseases of the digestive system: Z87.19

## 2016-03-06 HISTORY — DX: Inflammatory liver disease, unspecified: K75.9

## 2016-03-06 HISTORY — DX: Fracture of neck, unspecified, initial encounter: S12.9XXA

## 2016-03-06 HISTORY — DX: Low back pain: M54.5

## 2016-03-06 HISTORY — PX: ESOPHAGOGASTRODUODENOSCOPY (EGD) WITH PROPOFOL: SHX5813

## 2016-03-06 HISTORY — DX: Liver disease, unspecified: K76.9

## 2016-03-06 HISTORY — DX: Low back pain, unspecified: M54.50

## 2016-03-06 HISTORY — DX: Fracture of unspecified part of neck of unspecified femur, initial encounter for closed fracture: S72.009A

## 2016-03-06 HISTORY — DX: Aneurysm of other specified arteries: I72.8

## 2016-03-06 HISTORY — DX: Gastritis, unspecified, without bleeding: K29.70

## 2016-03-06 SURGERY — ESOPHAGOGASTRODUODENOSCOPY (EGD) WITH PROPOFOL
Anesthesia: General

## 2016-03-06 MED ORDER — PROPOFOL 500 MG/50ML IV EMUL
INTRAVENOUS | Status: DC | PRN
  Administered 2016-03-06: 150 ug/kg/min via INTRAVENOUS

## 2016-03-06 MED ORDER — PROPOFOL 10 MG/ML IV BOLUS
INTRAVENOUS | Status: DC | PRN
  Administered 2016-03-06: 60 mg via INTRAVENOUS

## 2016-03-06 MED ORDER — LIDOCAINE HCL (PF) 2 % IJ SOLN
INTRAMUSCULAR | Status: DC | PRN
  Administered 2016-03-06: 40 mg via INTRADERMAL

## 2016-03-06 MED ORDER — SODIUM CHLORIDE 0.9 % IV SOLN: INTRAVENOUS | Status: DC

## 2016-03-06 MED ORDER — SODIUM CHLORIDE 0.9 % IV SOLN
INTRAVENOUS | Status: DC
Start: 2016-03-06 — End: 2016-03-06
  Administered 2016-03-06: 11:00:00 via INTRAVENOUS

## 2016-03-06 NOTE — Anesthesia Postprocedure Evaluation (Signed)
Anesthesia Post Note  Patient: Tina Bradley  Procedure(s) Performed: Procedure(s) (LRB): ESOPHAGOGASTRODUODENOSCOPY (EGD) WITH PROPOFOL (N/A)  Patient location during evaluation: PACU Anesthesia Type: General Level of consciousness: awake, oriented and awake and alert Pain management: pain level controlled Vital Signs Assessment: post-procedure vital signs reviewed and stable Respiratory status: spontaneous breathing, nonlabored ventilation and respiratory function stable Cardiovascular status: stable Anesthetic complications: no    Last Vitals:  Filed Vitals:   03/06/16 1120 03/06/16 1122  BP: 143/85 143/85  Pulse: 78 81  Temp: 36.7 C   Resp: 15 15    Last Pain: There were no vitals filed for this visit.               [employer]

## 2016-03-06 NOTE — Anesthesia Preprocedure Evaluation (Signed)
Anesthesia Evaluation  Patient identified by MRN, date of birth, ID band Patient awake    Reviewed: Allergy & Precautions, H&P , NPO status , Patient's Chart, lab work & pertinent test results, reviewed documented beta blocker date and time   History of Anesthesia Complications Negative for: history of anesthetic complications  Airway Mallampati: I  TM Distance: >3 FB Neck ROM: full    Dental no notable dental hx. (+) Caps, Poor Dentition, Missing   Pulmonary neg shortness of breath, neg sleep apnea, neg COPD, neg recent URI, former smoker,    Pulmonary exam normal breath sounds clear to auscultation       Cardiovascular Exercise Tolerance: Good hypertension (no longer on medications), (-) angina+ Peripheral Vascular Disease  (-) CAD, (-) Past MI, (-) Cardiac Stents and (-) CABG Normal cardiovascular exam(-) dysrhythmias (-) Valvular Problems/Murmurs Rhythm:regular Rate:Normal     Neuro/Psych negative neurological ROS  negative psych ROS   GI/Hepatic hiatal hernia, GERD  ,(+) Hepatitis - (s/p treatment), C  Endo/Other  negative endocrine ROS  Renal/GU negative Renal ROS  negative genitourinary   Musculoskeletal   Abdominal   Peds  Hematology negative hematology ROS (+)   Anesthesia Other Findings Past Medical History:   Back pain                                                    Hypertension                                                 GERD (gastroesophageal reflux disease)                       Anemia                                                       Aneurysm of splenic artery (HCC)                             Gastritis                                                    Gout                                                         Hepatitis                                                      Comment:c w/o coma   History of hiatal hernia  Hip fracture (HCC)                                            IBS (irritable bowel syndrome)                               Liver disease                                                Lumbago                                                      Multinodular goiter                                          Neck fracture (HCC)                                          Reproductive/Obstetrics negative OB ROS                             Anesthesia Physical Anesthesia Plan  ASA: II  Anesthesia Plan: General   Post-op Pain Management:    Induction:   Airway Management Planned:   Additional Equipment:   Intra-op Plan:   Post-operative Plan:   Informed Consent: I have reviewed the patients History and Physical, chart, labs and discussed the procedure including the risks, benefits and alternatives for the proposed anesthesia with the patient or authorized representative who has indicated his/her understanding and acceptance.   Dental Advisory Given  Plan Discussed with: Anesthesiologist, CRNA and Surgeon  Anesthesia Plan Comments:         Anesthesia Quick Evaluation

## 2016-03-06 NOTE — Op Note (Signed)
[facility] Gastroenterology Patient Name: Tina Bradley Procedure Date: 03/06/2016 11:07 AM MRN: [id number] Account #: [id number] Date of Birth: 1951/06/02 Admit Type: Outpatient Age: 65 Room: [facility] ENDO ROOM 4 Gender: Female Note Status: Finalized Procedure:            Upper GI endoscopy Indications:          Early satiety, Nausea with vomiting, Weight loss Providers:            Lupita Dawn. Candace Cruise, MD Referring MD:         Adrian Prows (Referring MD) Medicines:            Monitored Anesthesia Care Complications:        No immediate complications. Procedure:            Pre-Anesthesia Assessment:                       - Prior to the procedure, a History and Physical was                        performed, and patient medications, allergies and                        sensitivities were reviewed. The patient's tolerance of                        previous anesthesia was reviewed.                       - The risks and benefits of the procedure and the                        sedation options and risks were discussed with the                        patient. All questions were answered and informed                        consent was obtained.                       - After reviewing the risks and benefits, the patient                        was deemed in satisfactory condition to undergo the                        procedure.                       After obtaining informed consent, the endoscope was                        passed under direct vision. Throughout the procedure,                        the patient's blood pressure, pulse, and oxygen                        saturations were monitored continuously. The  Colonoscope was introduced through the mouth, and                        advanced to the second part of duodenum. The upper GI                        endoscopy was accomplished without difficulty. The                        patient tolerated the  procedure well. Findings:      The examined esophagus was normal.      Localized moderate inflammation characterized by linear erosions was       found in the gastric antrum. Biopsies were taken with a cold forceps for       Helicobacter pylori testing.      The exam was otherwise without abnormality.      The examined duodenum was normal. Impression:           - Normal esophagus.                       - Gastritis. Biopsied.                       - The examination was otherwise normal.                       - Normal examined duodenum. Recommendation:       - Discharge patient to home.                       - Observe patient's clinical course.                       - Continue present medications.                       - The findings and recommendations were discussed with                        the patient.                       - Use a proton pump inhibitor PO daily.                       - Consider gastric emptying scan if symptoms persist Procedure Code(s):    --- Professional ---                       [phone], Esophagogastroduodenoscopy, flexible, transoral;                        with biopsy, single or multiple Diagnosis Code(s):    --- Professional ---                       K29.70, Gastritis, unspecified, without bleeding                       R68.81, Early satiety                       R63.4, Abnormal weight loss  R11.2, Nausea with vomiting, unspecified CPT copyright 2016 American Medical Association. All rights reserved. The codes documented in this report are preliminary and upon coder review may  be revised to meet current compliance requirements. Hulen Luster, MD 03/06/2016 11:16:09 AM This report has been signed electronically. Number of Addenda: 0 Note Initiated On: 03/06/2016 11:07 AM      [facility]

## 2016-03-06 NOTE — Transfer of Care (Signed)
Immediate Anesthesia Transfer of Care Note  Patient: Tina Bradley  Procedure(s) Performed: Procedure(s): ESOPHAGOGASTRODUODENOSCOPY (EGD) WITH PROPOFOL (N/A)  Patient Location: PACU  Anesthesia Type:General  Level of Consciousness: awake, alert  and oriented  Airway & Oxygen Therapy: Patient Spontanous Breathing and Patient connected to nasal cannula oxygen  Post-op Assessment: Report given to RN and Post -op Vital signs reviewed and stable  Post vital signs: Reviewed and stable  Last Vitals:  Filed Vitals:   03/06/16 1028 03/06/16 1122  BP: 158/80 143/85  Pulse: 81 81  Temp: 36.4 C   Resp: 18 15    Last Pain: There were no vitals filed for this visit.       Complications: No apparent anesthesia complications

## 2016-03-06 NOTE — H&P (Signed)
Primary Care Physician:  Leonel Ramsay, MD Primary Gastroenterologist:  Dr. Candace Cruise  Pre-Procedure History & Physical: HPI:  Tina Bradley is a 65 y.o. female is here for an EGD.   Past Medical History  Diagnosis Date  . Back pain   . Hypertension   . GERD (gastroesophageal reflux disease)   . Anemia   . Aneurysm of splenic artery (HCC)   . Gastritis   . Gout   . 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 History of hiatal hernia   . Hip fracture ([place])   . IBS (irritable bowel syndrome)   . Liver disease   . Lumbago   . Multinodular goiter   . Neck fracture [facility])     Past Surgical History  Procedure Laterality Date  . Thyroid surgery    . Tonsillectomy    . Appendectomy    . Cholecystectomy    . Tubal ligation    . Hernia repair    . Carpal tunnel release      Prior to Admission medications   Medication Sig Start Date End Date Taking? Authorizing Provider  fluticasone (FLONASE) 50 MCG/ACT nasal spray Place 2 sprays into both nostrils daily.   Yes Historical Provider, MD  folic acid (FOLVITE) 1 MG tablet Take 1 mg by mouth daily.   Yes Historical Provider, MD  HYDROcodone-acetaminophen (NORCO/VICODIN) 5-325 MG tablet Take 1 tablet by mouth every 6 (six) hours as needed for moderate pain.   Yes Historical Provider, MD  mirtazapine (REMERON) 7.5 MG tablet Take 7.5 mg by mouth at bedtime.   Yes Historical Provider, MD  ondansetron (ZOFRAN-ODT) 8 MG disintegrating tablet Take 8 mg by mouth every 8 (eight) hours as needed for nausea or vomiting.   Yes Historical Provider, MD  ALPRAZolam Duanne Moron) 0.5 MG tablet Take 0.5 mg by mouth at bedtime as needed for anxiety.    Historical Provider, MD  amitriptyline (ELAVIL) 75 MG tablet Take 75 mg by mouth at bedtime.    Historical Provider, MD  metaxalone (SKELAXIN) 800 MG tablet Take 1 tablet (800 mg total) by mouth 3 (three) times daily. 06/13/15   Lorin Picket, PA-C  metoprolol succinate (TOPROL-XL) 50 MG 24 hr tablet Take 50 mg by mouth  daily. Reported on 03/06/2016    Historical Provider, MD  naproxen (NAPROSYN) 500 MG tablet Take 1 tablet (500 mg total) by mouth 2 (two) times daily with a meal. 06/13/15   Lorin Picket, PA-C  omeprazole (PRILOSEC) 40 MG capsule Take 80 mg by mouth daily.    Historical Provider, MD    Allergies as of 02/26/2016 - Review Complete 06/13/2015  Allergen Reaction Noted  . Augmentin [amoxicillin-pot clavulanate] Other (See Comments) 05/21/2015  . Darvon [propoxyphene] Nausea And Vomiting and Rash 05/21/2015    Family History  Problem Relation Age of Onset  . Breast cancer Neg Hx     Social History   Social History  . Marital Status: Married    Spouse Name: N/A  . Number of Children: N/A  . Years of Education: N/A   Occupational History  . Not on file.   Social History Main Topics  . Smoking status: Former Smoker -- 0.50 packs/day    Types: Cigarettes  . Smokeless tobacco: Former Systems developer  . Alcohol Use: Yes  . Drug Use: Yes    Special: Marijuana  . Sexual Activity: Not on file   Other Topics Concern  . Not on file  Social History Narrative    Review of Systems: See HPI, otherwise negative ROS  Physical Exam: BP 158/80 mmHg  Pulse 81  Temp(Src) 97.6 F (36.4 C) (Tympanic)  Resp 18  Ht 5\' 3"  (1.6 m)  Wt 108 lb (48.988 kg)  BMI 19.14 kg/m2  SpO2 97% General:   Alert,  pleasant and cooperative in NAD Head:  Normocephalic and atraumatic. Neck:  Supple; no masses or thyromegaly. Lungs:  Clear throughout to auscultation.    Heart:  Regular rate and rhythm. Abdomen:  Soft, nontender and nondistended. Normal bowel sounds, without guarding, and without rebound.   Neurologic:  Alert and  oriented x4;  grossly normal neurologically.  Impression/Plan: Tina Bradley is here for an egd to be performed for wt loss, dysphagia, epigastric pain, nausea, vomiting  Risks, benefits, limitations, and alternatives regarding EGD} have been reviewed with the patient.  Questions have  been answered.  All parties agreeable.   Balian Schaller, Lupita Dawn, MD  03/06/2016, 10:56 AM

## 2016-03-09 LAB — SURGICAL PATHOLOGY

## 2016-03-10 ENCOUNTER — Encounter: Payer: Self-pay | Admitting: Gastroenterology

## 2016-03-20 ENCOUNTER — Other Ambulatory Visit: Payer: Self-pay | Admitting: Infectious Diseases

## 2016-03-20 DIAGNOSIS — R928 Other abnormal and inconclusive findings on diagnostic imaging of breast: Secondary | ICD-10-CM

## 2016-03-31 ENCOUNTER — Other Ambulatory Visit: Payer: BLUE CROSS/BLUE SHIELD

## 2016-03-31 ENCOUNTER — Ambulatory Visit: Payer: BLUE CROSS/BLUE SHIELD | Attending: Infectious Diseases

## 2016-04-10 ENCOUNTER — Ambulatory Visit
Admission: RE | Admit: 2016-04-10 | Discharge: 2016-04-10 | Disposition: A | Discharge status: Home / Self Care | Payer: BLUE CROSS/BLUE SHIELD | Source: Ambulatory Visit | Attending: Infectious Diseases | Admitting: Infectious Diseases

## 2016-04-10 DIAGNOSIS — R921 Mammographic calcification found on diagnostic imaging of breast: Secondary | ICD-10-CM | POA: Insufficient documentation

## 2016-04-10 DIAGNOSIS — R928 Other abnormal and inconclusive findings on diagnostic imaging of breast: Secondary | ICD-10-CM

## 2016-05-20 ENCOUNTER — Other Ambulatory Visit: Payer: Self-pay | Admitting: Vascular Surgery

## 2016-05-20 DIAGNOSIS — I728 Aneurysm of other specified arteries: Secondary | ICD-10-CM

## 2016-05-27 ENCOUNTER — Ambulatory Visit: Payer: BLUE CROSS/BLUE SHIELD

## 2016-08-06 ENCOUNTER — Encounter: Payer: Self-pay | Admitting: *Deleted

## 2016-08-07 ENCOUNTER — Ambulatory Visit: Payer: BLUE CROSS/BLUE SHIELD | Admitting: Anesthesiology

## 2016-08-07 ENCOUNTER — Ambulatory Visit
Admission: RE | Admit: 2016-08-07 | Discharge: 2016-08-07 | Disposition: A | Discharge status: Home / Self Care | Payer: BLUE CROSS/BLUE SHIELD | Source: Ambulatory Visit | Attending: Gastroenterology | Admitting: Gastroenterology

## 2016-08-07 ENCOUNTER — Encounter: Payer: Self-pay | Admitting: *Deleted

## 2016-08-07 ENCOUNTER — Encounter
Admission: RE | Disposition: A | Discharge status: Home / Self Care | Payer: Self-pay | Source: Ambulatory Visit | Attending: Gastroenterology

## 2016-08-07 DIAGNOSIS — K5730 Diverticulosis of large intestine without perforation or abscess without bleeding: Secondary | ICD-10-CM | POA: Insufficient documentation

## 2016-08-07 DIAGNOSIS — I728 Aneurysm of other specified arteries: Secondary | ICD-10-CM | POA: Insufficient documentation

## 2016-08-07 DIAGNOSIS — M109 Gout, unspecified: Secondary | ICD-10-CM | POA: Diagnosis not present

## 2016-08-07 DIAGNOSIS — D649 Anemia, unspecified: Secondary | ICD-10-CM | POA: Insufficient documentation

## 2016-08-07 DIAGNOSIS — Z5309 Procedure and treatment not carried out because of other contraindication: Secondary | ICD-10-CM | POA: Diagnosis not present

## 2016-08-07 DIAGNOSIS — K589 Irritable bowel syndrome without diarrhea: Secondary | ICD-10-CM | POA: Insufficient documentation

## 2016-08-07 DIAGNOSIS — F419 Anxiety disorder, unspecified: Secondary | ICD-10-CM | POA: Diagnosis not present

## 2016-08-07 DIAGNOSIS — Z880 Allergy status to penicillin: Secondary | ICD-10-CM | POA: Diagnosis not present

## 2016-08-07 DIAGNOSIS — M549 Dorsalgia, unspecified: Secondary | ICD-10-CM | POA: Diagnosis not present

## 2016-08-07 DIAGNOSIS — K219 Gastro-esophageal reflux disease without esophagitis: Secondary | ICD-10-CM | POA: Diagnosis not present

## 2016-08-07 DIAGNOSIS — Z888 Allergy status to other drugs, medicaments and biological substances status: Secondary | ICD-10-CM | POA: Diagnosis not present

## 2016-08-07 DIAGNOSIS — K449 Diaphragmatic hernia without obstruction or gangrene: Secondary | ICD-10-CM | POA: Insufficient documentation

## 2016-08-07 DIAGNOSIS — F329 Major depressive disorder, single episode, unspecified: Secondary | ICD-10-CM | POA: Diagnosis not present

## 2016-08-07 DIAGNOSIS — Z79899 Other long term (current) drug therapy: Secondary | ICD-10-CM | POA: Insufficient documentation

## 2016-08-07 DIAGNOSIS — Z1211 Encounter for screening for malignant neoplasm of colon: Secondary | ICD-10-CM | POA: Diagnosis not present

## 2016-08-07 DIAGNOSIS — I10 Essential (primary) hypertension: Secondary | ICD-10-CM | POA: Insufficient documentation

## 2016-08-07 DIAGNOSIS — K769 Liver disease, unspecified: Secondary | ICD-10-CM | POA: Diagnosis not present

## 2016-08-07 HISTORY — DX: Aneurysm of other specified arteries: I72.8

## 2016-08-07 HISTORY — DX: Anxiety disorder, unspecified: F41.9

## 2016-08-07 HISTORY — DX: Major depressive disorder, single episode, unspecified: F32.9

## 2016-08-07 HISTORY — PX: COLONOSCOPY: SHX5424

## 2016-08-07 HISTORY — DX: Depression, unspecified: F32.A

## 2016-08-07 SURGERY — COLONOSCOPY
Anesthesia: General

## 2016-08-07 MED ORDER — MIDAZOLAM HCL 2 MG/2ML IJ SOLN
INTRAMUSCULAR | Status: DC | PRN
  Administered 2016-08-07: .5 mg via INTRAVENOUS
  Administered 2016-08-07: 0.5 mg via INTRAVENOUS

## 2016-08-07 MED ORDER — SODIUM CHLORIDE 0.9 % IV SOLN
INTRAVENOUS | Status: DC
  Administered 2016-08-07: 12:00:00 via INTRAVENOUS

## 2016-08-07 MED ORDER — PROPOFOL 500 MG/50ML IV EMUL
INTRAVENOUS | Status: DC | PRN
  Administered 2016-08-07: 140 ug/kg/min via INTRAVENOUS

## 2016-08-07 MED ORDER — FENTANYL CITRATE (PF) 100 MCG/2ML IJ SOLN
INTRAMUSCULAR | Status: DC | PRN
  Administered 2016-08-07: 50 ug via INTRAVENOUS

## 2016-08-07 MED ORDER — PROPOFOL 10 MG/ML IV BOLUS
INTRAVENOUS | Status: DC | PRN
  Administered 2016-08-07: 60 mg via INTRAVENOUS

## 2016-08-07 MED ORDER — SODIUM CHLORIDE 0.9 % IV SOLN
INTRAVENOUS | Status: DC
  Administered 2016-08-07: 12:00:00 via INTRAVENOUS
  Administered 2016-08-07: 1000 mL via INTRAVENOUS

## 2016-08-07 NOTE — H&P (Signed)
Outpatient short stay form Pre-procedure 08/07/2016 11:45 AM Lollie Sails MD  Primary Physician: Dr. Adrian Prows  Reason for visit:  Colon cancer screening  History of present illness:  Patient is a 65 year old female presenting today as above. She had some problems tolerating the prep. She states she only got about half of the down. She states she had some diarrhea for about 2 days before taking the prep and feels that he was relatively clear. There was no solid stool this morning. She denies use of any aspirin products or blood thinning agents.  Patient does have a history of diarrhea predominant IBS and her last colonoscopy was incomplete due to tortuous colon. She also had a flexible sigmoidoscopy done on 2001 that showed a very sharp turn at about 25 cm. The colon appeared normal up to that level. I believe this is about the extent that her last colonoscopy was able to go as well. I discussed this with with the patient and told her that if it also found to be a difficult procedure as above we would need to hold and do alternative evaluation.    Current Facility-Administered Medications:  .  0.9 %  sodium chloride infusion, , Intravenous, Continuous, Lollie Sails, MD, Last Rate: 20 mL/hr at 08/07/16 1044, 1,000 mL at 08/07/16 1044 .  0.9 %  sodium chloride infusion, , Intravenous, Continuous, Lollie Sails, MD  Prescriptions Prior to Admission  Medication Sig Dispense Refill Last Dose  . citalopram (CELEXA) 10 MG tablet Take 10 mg by mouth daily.     . pantoprazole (PROTONIX) 40 MG tablet Take 40 mg by mouth daily.     [name] ALPRAZolam (XANAX) 0.5 MG tablet Take 0.5 mg by mouth 2 (two) times daily as needed for anxiety (q 12 hrs prn).      [name] amitriptyline (ELAVIL) 25 MG tablet Take 25 mg by mouth at bedtime.      [name] HYDROcodone-acetaminophen (NORCO/VICODIN) 5-325 MG tablet Take 1 tablet by mouth 2 (two) times daily as needed for moderate pain.      . naproxen (NAPROSYN) 500  MG tablet Take 1 tablet (500 mg total) by mouth 2 (two) times daily with a meal. 60 tablet 0   . [DISCONTINUED] fluticasone (FLONASE) 50 MCG/ACT nasal spray Place 2 sprays into both nostrils daily.     . [DISCONTINUED] folic acid (FOLVITE) 1 MG tablet Take 1 mg by mouth daily.     . [DISCONTINUED] metaxalone (SKELAXIN) 800 MG tablet Take 1 tablet (800 mg total) by mouth 3 (three) times daily. 21 tablet 0   . [DISCONTINUED] metoprolol succinate (TOPROL-XL) 50 MG 24 hr tablet Take 50 mg by mouth daily. Reported on 03/06/2016   Not Taking at Unknown time  . [DISCONTINUED] mirtazapine (REMERON) 7.5 MG tablet Take 7.5 mg by mouth at bedtime.     . [DISCONTINUED] omeprazole (PRILOSEC) 40 MG capsule Take 80 mg by mouth daily.   Not Taking at Unknown time  . [DISCONTINUED] ondansetron (ZOFRAN-ODT) 8 MG disintegrating tablet Take 8 mg by mouth every 8 (eight) hours as needed for nausea or vomiting.        Allergies  Allergen Reactions  . Augmentin [Amoxicillin-Pot Clavulanate] Other (See Comments)    She states upset stomach and diarrhea. Also blisters in mouth.  . Wellbutrin [Bupropion] Other (See Comments)    Bad dreams  . Propoxyphene Nausea And Vomiting and Rash     Past Medical History:  Diagnosis Date  . Anemia   .  Aneurysm of splenic artery (HCC)   . Aneurysm of splenic artery (HCC)   . Anxiety   . Back pain   . Depression   . Gastritis   . GERD (gastroesophageal reflux disease)   . Gout   . Hepatitis    c w/o coma  . Hip fracture ([place])   . History of hiatal hernia   . Hypertension   . IBS (irritable bowel syndrome)   . Liver disease   . Lumbago   . Multinodular goiter   . Neck fracture ([place])   . 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    HEENT: Normocephalic atraumatic eyes are anicteric    Other:     Pertinant exam for procedure: Soft  nontender nondistended bowel sounds positive normoactive.    Planned proceedures: Colonoscopy and indicated procedures. I have discussed the risks benefits and complications of procedures to include not limited to bleeding, infection, perforation and the risk of sedation and the patient wishes to proceed.    Lollie Sails, MD Gastroenterology 08/07/2016  11:45 AM

## 2016-08-07 NOTE — Transfer of Care (Signed)
Immediate Anesthesia Transfer of Care Note  Patient: Tina Bradley  Procedure(s) Performed: Procedure(s): COLONOSCOPY (N/A)  Patient Location: PACU  Anesthesia Type:General  Level of Consciousness: awake  Airway & Oxygen Therapy: Patient Spontanous Breathing and Patient connected to nasal cannula oxygen  Post-op Assessment: Report given to RN and Post -op Vital signs reviewed and stable  Post vital signs: Reviewed and stable  Last Vitals:  Vitals:   08/07/16 1029 08/07/16 1214  BP: (!) 173/109 (P) 106/70  Pulse: 99 (P) 70  Resp: 20 (P) 20  Temp: 36.2 C (P) 36.6 C    Last Pain:  Vitals:   08/07/16 1214  TempSrc: (P) Tympanic         Complications: No apparent anesthesia complications

## 2016-08-07 NOTE — Anesthesia Procedure Notes (Signed)
Performed by: Ashaya Raftery       

## 2016-08-07 NOTE — Anesthesia Preprocedure Evaluation (Signed)
Anesthesia Evaluation  Patient identified by MRN, date of birth, ID band Patient awake    Reviewed: Allergy & Precautions, H&P , NPO status , Patient's Chart, lab work & pertinent test results  Airway Mallampati: III  TM Distance: <3 FB Neck ROM: limited    Dental  (+) Poor Dentition, Chipped, Missing   Pulmonary neg shortness of breath, COPD, Current Smoker,    Pulmonary exam normal breath sounds clear to auscultation       Cardiovascular Exercise Tolerance: Good hypertension, (-) angina+ Peripheral Vascular Disease  (-) Past MI and (-) DOE Normal cardiovascular exam Rhythm:regular Rate:Normal     Neuro/Psych PSYCHIATRIC DISORDERS Anxiety Depression negative neurological ROS     GI/Hepatic hiatal hernia, GERD  Controlled,(+) Hepatitis -  Endo/Other  negative endocrine ROS  Renal/GU negative Renal ROS  negative genitourinary   Musculoskeletal   Abdominal   Peds  Hematology negative hematology ROS (+)   Anesthesia Other Findings Past Medical History: No date: Anemia No date: Aneurysm of splenic artery (HCC) No date: Aneurysm of splenic artery (HCC) No date: Anxiety No date: Back pain No date: Depression No date: Gastritis No date: GERD (gastroesophageal reflux disease) No date: Gout No date: Hepatitis     Comment: c w/o coma No date: Hip fracture (HCC) No date: History of hiatal hernia No date: Hypertension No date: IBS (irritable bowel syndrome) No date: Liver disease No date: Lumbago No date: Multinodular goiter No date: Neck fracture (HCC) No date: Splenic artery aneurysm (HCC)  Past Surgical History: No date: APPENDECTOMY No date: CARPAL TUNNEL RELEASE No date: CHOLECYSTECTOMY 03/06/2016: ESOPHAGOGASTRODUODENOSCOPY (EGD) WITH PROPOFOL N/A     Comment: Procedure: ESOPHAGOGASTRODUODENOSCOPY (EGD)               WITH PROPOFOL;  Surgeon: Hulen Luster, MD;                Location: [facility] ENDOSCOPY;   Service:               Gastroenterology;  Laterality: N/A; No date: HERNIA REPAIR No date: THYROID SURGERY No date: TONSILLECTOMY No date: TUBAL LIGATION  BMI    Body Mass Index:  19.31 kg/m      Reproductive/Obstetrics negative OB ROS                             Anesthesia Physical Anesthesia Plan  ASA: III  Anesthesia Plan: General   Post-op Pain Management:    Induction:   Airway Management Planned:   Additional Equipment:   Intra-op Plan:   Post-operative Plan:   Informed Consent: I have reviewed the patients History and Physical, chart, labs and discussed the procedure including the risks, benefits and alternatives for the proposed anesthesia with the patient or authorized representative who has indicated his/her understanding and acceptance.   Dental Advisory Given  Plan Discussed with: Anesthesiologist, CRNA and Surgeon  Anesthesia Plan Comments:         Anesthesia Quick Evaluation

## 2016-08-07 NOTE — Anesthesia Procedure Notes (Signed)
Date/Time: 08/07/2016 11:45 AM Performed by: Allean Found Pre-anesthesia Checklist: Patient identified, Emergency Drugs available, Suction available, Patient being monitored and Timeout performed Patient Re-evaluated:Patient Re-evaluated prior to inductionOxygen Delivery Method: Nasal cannula

## 2016-08-07 NOTE — Op Note (Signed)
[facility] Gastroenterology Patient Name: Tina Bradley Procedure Date: 08/07/2016 11:42 AM MRN: [id number] Account #: [id number] Date of Birth: 1951-07-19 Admit Type: Outpatient Age: 65 Room: [facility] ENDO ROOM 3 Gender: Female Note Status: Finalized Procedure:            Colonoscopy Indications:          Screening for colorectal malignant neoplasm Providers:            Lollie Sails, MD Referring MD:         Adrian Prows (Referring MD) Medicines:            Monitored Anesthesia Care Complications:        No immediate complications. Procedure:            Pre-Anesthesia Assessment:                       - ASA Grade Assessment: III - A patient with severe                        systemic disease.                       After obtaining informed consent, the colonoscope was                        passed under direct vision. Throughout the procedure,                        the patient's blood pressure, pulse, and oxygen                        saturations were monitored continuously. The                        Colonoscope was introduced through the anus with the                        intention of advancing to the cecum. The scope was                        advanced to the sigmoid colon before the procedure was                        aborted. Medications were given. The colonoscopy was                        extremely difficult due to a tortuous colon. The                        quality of the bowel preparation was good. Findings:      A few small-mouthed diverticula were found in the distal sigmoid colon.       The scope was carefully passed to about 25 cm from the anal verge. There       is a sharp angulation at this point that prevents further passage of the       scope.      Biopsies for histology were taken with a cold forceps from the sigmoid       colon and rectum for evaluation of microscopic colitis.      The digital rectal exam was  normal. Impression:            - Diverticulosis in the distal sigmoid colon.                       - Biopsies were taken with a cold forceps from the                        sigmoid colon and rectum for evaluation of microscopic                        colitis.                       - Incomplete colonoscopy due to sharp colonic                        angulation and apparent "tethering". Recommendation:       - Discharge patient to home.                       - Perform a virtual colonoscopy at appointment to be                        scheduled. Procedure Code(s):    --- Professional ---                       [phone], 15, Colonoscopy, flexible; with biopsy, single                        or multiple Diagnosis Code(s):    --- Professional ---                       Z12.11, Encounter for screening for malignant neoplasm                        of colon                       K57.30, Diverticulosis of large intestine without                        perforation or abscess without bleeding CPT copyright 2016 American Medical Association. All rights reserved. The codes documented in this report are preliminary and upon coder review may  be revised to meet current compliance requirements. Lollie Sails, MD 08/07/2016 12:13:56 PM This report has been signed electronically. Number of Addenda: 0 Note Initiated On: 08/07/2016 11:42 AM Total Procedure Duration: 0 hours 8 minutes 9 seconds       [facility]

## 2016-08-08 ENCOUNTER — Encounter: Payer: Self-pay | Admitting: Gastroenterology

## 2016-08-08 NOTE — Anesthesia Postprocedure Evaluation (Signed)
Anesthesia Post Note  Patient: [name]  Procedure(s) Performed: Procedure(s) (LRB): COLONOSCOPY (N/A)  Patient location during evaluation: Endoscopy Anesthesia Type: General Level of consciousness: awake and alert Pain management: pain level controlled Vital Signs Assessment: post-procedure vital signs reviewed and stable Respiratory status: spontaneous breathing, nonlabored ventilation, respiratory function stable and patient connected to nasal cannula oxygen Cardiovascular status: blood pressure returned to baseline and stable Postop Assessment: no signs of nausea or vomiting Anesthetic complications: no    Last Vitals:  Vitals:   08/07/16 1234 08/07/16 1244  BP: (!) 146/97 (!) 159/93  Pulse: 81 79  Resp: 18 14  Temp:      Last Pain:  Vitals:   08/07/16 1214  TempSrc: Tympanic                 Tina Bradley

## 2016-08-10 LAB — SURGICAL PATHOLOGY

## 2016-08-25 ENCOUNTER — Other Ambulatory Visit: Payer: Self-pay | Admitting: Gastroenterology

## 2016-08-25 DIAGNOSIS — R6881 Early satiety: Secondary | ICD-10-CM

## 2016-08-25 DIAGNOSIS — R634 Abnormal weight loss: Secondary | ICD-10-CM

## 2016-08-25 DIAGNOSIS — R1084 Generalized abdominal pain: Secondary | ICD-10-CM

## 2016-08-31 ENCOUNTER — Ambulatory Visit
Admission: RE | Admit: 2016-08-31 | Discharge: 2016-08-31 | Disposition: A | Discharge status: Home / Self Care | Payer: BLUE CROSS/BLUE SHIELD | Source: Ambulatory Visit | Attending: Gastroenterology | Admitting: Gastroenterology

## 2016-08-31 DIAGNOSIS — I728 Aneurysm of other specified arteries: Secondary | ICD-10-CM | POA: Diagnosis not present

## 2016-08-31 DIAGNOSIS — K5730 Diverticulosis of large intestine without perforation or abscess without bleeding: Secondary | ICD-10-CM | POA: Insufficient documentation

## 2016-08-31 DIAGNOSIS — I708 Atherosclerosis of other arteries: Secondary | ICD-10-CM | POA: Insufficient documentation

## 2016-08-31 DIAGNOSIS — R634 Abnormal weight loss: Secondary | ICD-10-CM | POA: Insufficient documentation

## 2016-08-31 DIAGNOSIS — K439 Ventral hernia without obstruction or gangrene: Secondary | ICD-10-CM | POA: Insufficient documentation

## 2016-08-31 DIAGNOSIS — R918 Other nonspecific abnormal finding of lung field: Secondary | ICD-10-CM | POA: Insufficient documentation

## 2016-08-31 DIAGNOSIS — R1084 Generalized abdominal pain: Secondary | ICD-10-CM | POA: Insufficient documentation

## 2016-08-31 DIAGNOSIS — R6881 Early satiety: Secondary | ICD-10-CM | POA: Diagnosis present

## 2016-08-31 MED ORDER — IOPAMIDOL (ISOVUE-300) INJECTION 61%
100.0000 mL | Freq: Once | INTRAVENOUS | Status: AC | PRN
  Administered 2016-08-31: 85 mL via INTRAVENOUS

## 2016-09-02 LAB — POCT I-STAT CREATININE: Creatinine, Ser: 1 mg/dL (ref 0.44–1.00)

## 2017-02-08 ENCOUNTER — Other Ambulatory Visit: Payer: Self-pay | Admitting: Physical Medicine and Rehabilitation

## 2017-02-08 DIAGNOSIS — M5416 Radiculopathy, lumbar region: Secondary | ICD-10-CM

## 2017-02-17 ENCOUNTER — Ambulatory Visit
Admission: RE | Admit: 2017-02-17 | Discharge: 2017-02-17 | Disposition: A | Discharge status: Home / Self Care | Payer: BLUE CROSS/BLUE SHIELD | Source: Ambulatory Visit | Attending: Physical Medicine and Rehabilitation | Admitting: Physical Medicine and Rehabilitation

## 2017-02-17 DIAGNOSIS — M5136 Other intervertebral disc degeneration, lumbar region: Secondary | ICD-10-CM | POA: Diagnosis present

## 2017-02-17 DIAGNOSIS — M48062 Spinal stenosis, lumbar region with neurogenic claudication: Secondary | ICD-10-CM | POA: Diagnosis present

## 2017-02-17 DIAGNOSIS — M5416 Radiculopathy, lumbar region: Secondary | ICD-10-CM | POA: Diagnosis present

## 2017-03-12 ENCOUNTER — Other Ambulatory Visit: Payer: Self-pay | Admitting: Infectious Diseases

## 2017-03-12 DIAGNOSIS — R634 Abnormal weight loss: Secondary | ICD-10-CM

## 2017-03-12 DIAGNOSIS — Z720 Tobacco use: Secondary | ICD-10-CM

## 2017-03-12 DIAGNOSIS — R911 Solitary pulmonary nodule: Secondary | ICD-10-CM

## 2017-03-23 ENCOUNTER — Other Ambulatory Visit: Payer: Self-pay | Admitting: Infectious Diseases

## 2017-03-23 DIAGNOSIS — Z1239 Encounter for other screening for malignant neoplasm of breast: Secondary | ICD-10-CM

## 2017-03-23 DIAGNOSIS — R921 Mammographic calcification found on diagnostic imaging of breast: Secondary | ICD-10-CM

## 2017-03-30 ENCOUNTER — Ambulatory Visit
Admission: RE | Admit: 2017-03-30 | Discharge: 2017-03-30 | Disposition: A | Discharge status: Home / Self Care | Payer: BLUE CROSS/BLUE SHIELD | Source: Ambulatory Visit | Attending: Infectious Diseases | Admitting: Infectious Diseases

## 2017-03-30 DIAGNOSIS — J439 Emphysema, unspecified: Secondary | ICD-10-CM | POA: Diagnosis not present

## 2017-03-30 DIAGNOSIS — Z720 Tobacco use: Secondary | ICD-10-CM | POA: Insufficient documentation

## 2017-03-30 DIAGNOSIS — I700 Atherosclerosis of aorta: Secondary | ICD-10-CM | POA: Diagnosis not present

## 2017-03-30 DIAGNOSIS — I2510 Atherosclerotic heart disease of native coronary artery without angina pectoris: Secondary | ICD-10-CM | POA: Insufficient documentation

## 2017-03-30 DIAGNOSIS — R634 Abnormal weight loss: Secondary | ICD-10-CM

## 2017-03-30 DIAGNOSIS — R911 Solitary pulmonary nodule: Secondary | ICD-10-CM | POA: Diagnosis present

## 2017-04-26 ENCOUNTER — Ambulatory Visit
Admission: RE | Admit: 2017-04-26 | Discharge: 2017-04-26 | Disposition: A | Discharge status: Home / Self Care | Payer: BLUE CROSS/BLUE SHIELD | Source: Ambulatory Visit | Attending: Infectious Diseases | Admitting: Infectious Diseases

## 2017-04-26 DIAGNOSIS — R928 Other abnormal and inconclusive findings on diagnostic imaging of breast: Secondary | ICD-10-CM | POA: Diagnosis present

## 2017-04-26 DIAGNOSIS — Z1239 Encounter for other screening for malignant neoplasm of breast: Secondary | ICD-10-CM

## 2017-04-26 DIAGNOSIS — R921 Mammographic calcification found on diagnostic imaging of breast: Secondary | ICD-10-CM

## 2017-04-30 ENCOUNTER — Other Ambulatory Visit: Payer: Self-pay | Admitting: Infectious Diseases

## 2017-04-30 DIAGNOSIS — R928 Other abnormal and inconclusive findings on diagnostic imaging of breast: Secondary | ICD-10-CM

## 2017-07-26 ENCOUNTER — Other Ambulatory Visit: Payer: Self-pay | Admitting: Neurosurgery

## 2017-07-26 DIAGNOSIS — M4802 Spinal stenosis, cervical region: Secondary | ICD-10-CM

## 2017-07-30 ENCOUNTER — Ambulatory Visit
Admission: RE | Admit: 2017-07-30 | Discharge: 2017-07-30 | Disposition: A | Discharge status: Home / Self Care | Payer: BLUE CROSS/BLUE SHIELD | Source: Ambulatory Visit | Attending: Neurosurgery | Admitting: Neurosurgery

## 2017-07-30 DIAGNOSIS — Z981 Arthrodesis status: Secondary | ICD-10-CM | POA: Diagnosis not present

## 2017-07-30 DIAGNOSIS — R292 Abnormal reflex: Secondary | ICD-10-CM | POA: Insufficient documentation

## 2017-07-30 DIAGNOSIS — M4802 Spinal stenosis, cervical region: Secondary | ICD-10-CM | POA: Diagnosis present

## 2017-11-08 ENCOUNTER — Other Ambulatory Visit: Payer: Self-pay | Admitting: Physical Medicine and Rehabilitation

## 2017-11-08 DIAGNOSIS — M5416 Radiculopathy, lumbar region: Secondary | ICD-10-CM

## 2017-11-22 ENCOUNTER — Ambulatory Visit
Admission: RE | Admit: 2017-11-22 | Discharge: 2017-11-22 | Disposition: A | Discharge status: Home / Self Care | Payer: PPO | Source: Ambulatory Visit | Attending: Physical Medicine and Rehabilitation | Admitting: Physical Medicine and Rehabilitation

## 2017-11-22 DIAGNOSIS — M8938 Hypertrophy of bone, other site: Secondary | ICD-10-CM | POA: Insufficient documentation

## 2017-11-22 DIAGNOSIS — M5136 Other intervertebral disc degeneration, lumbar region: Secondary | ICD-10-CM | POA: Diagnosis not present

## 2017-11-22 DIAGNOSIS — M4316 Spondylolisthesis, lumbar region: Secondary | ICD-10-CM | POA: Diagnosis not present

## 2017-11-22 DIAGNOSIS — M5116 Intervertebral disc disorders with radiculopathy, lumbar region: Secondary | ICD-10-CM | POA: Insufficient documentation

## 2017-11-22 DIAGNOSIS — M5416 Radiculopathy, lumbar region: Secondary | ICD-10-CM | POA: Diagnosis present

## 2017-11-22 DIAGNOSIS — M545 Low back pain: Secondary | ICD-10-CM | POA: Diagnosis not present

## 2017-12-02 DIAGNOSIS — M5416 Radiculopathy, lumbar region: Secondary | ICD-10-CM | POA: Diagnosis not present

## 2017-12-06 DIAGNOSIS — Z720 Tobacco use: Secondary | ICD-10-CM | POA: Diagnosis not present

## 2017-12-06 DIAGNOSIS — J019 Acute sinusitis, unspecified: Secondary | ICD-10-CM | POA: Diagnosis not present

## 2017-12-06 DIAGNOSIS — J449 Chronic obstructive pulmonary disease, unspecified: Secondary | ICD-10-CM | POA: Diagnosis not present

## 2017-12-06 DIAGNOSIS — J209 Acute bronchitis, unspecified: Secondary | ICD-10-CM | POA: Diagnosis not present

## 2017-12-08 ENCOUNTER — Other Ambulatory Visit: Payer: Self-pay

## 2017-12-08 ENCOUNTER — Encounter
Admission: RE | Admit: 2017-12-08 | Discharge: 2017-12-08 | Disposition: A | Discharge status: Home / Self Care | Payer: PPO | Source: Ambulatory Visit | Attending: Neurosurgery | Admitting: Neurosurgery

## 2017-12-08 DIAGNOSIS — Z01812 Encounter for preprocedural laboratory examination: Secondary | ICD-10-CM | POA: Diagnosis not present

## 2017-12-08 DIAGNOSIS — F329 Major depressive disorder, single episode, unspecified: Secondary | ICD-10-CM | POA: Diagnosis not present

## 2017-12-08 DIAGNOSIS — Z85828 Personal history of other malignant neoplasm of skin: Secondary | ICD-10-CM | POA: Diagnosis not present

## 2017-12-08 DIAGNOSIS — K769 Liver disease, unspecified: Secondary | ICD-10-CM | POA: Insufficient documentation

## 2017-12-08 DIAGNOSIS — F419 Anxiety disorder, unspecified: Secondary | ICD-10-CM | POA: Insufficient documentation

## 2017-12-08 DIAGNOSIS — M109 Gout, unspecified: Secondary | ICD-10-CM | POA: Diagnosis not present

## 2017-12-08 DIAGNOSIS — K219 Gastro-esophageal reflux disease without esophagitis: Secondary | ICD-10-CM | POA: Insufficient documentation

## 2017-12-08 DIAGNOSIS — I447 Left bundle-branch block, unspecified: Secondary | ICD-10-CM | POA: Insufficient documentation

## 2017-12-08 DIAGNOSIS — D649 Anemia, unspecified: Secondary | ICD-10-CM | POA: Diagnosis not present

## 2017-12-08 DIAGNOSIS — M545 Low back pain: Secondary | ICD-10-CM | POA: Insufficient documentation

## 2017-12-08 DIAGNOSIS — I728 Aneurysm of other specified arteries: Secondary | ICD-10-CM | POA: Diagnosis not present

## 2017-12-08 DIAGNOSIS — J449 Chronic obstructive pulmonary disease, unspecified: Secondary | ICD-10-CM | POA: Insufficient documentation

## 2017-12-08 DIAGNOSIS — Z01810 Encounter for preprocedural cardiovascular examination: Secondary | ICD-10-CM | POA: Diagnosis not present

## 2017-12-08 DIAGNOSIS — I10 Essential (primary) hypertension: Secondary | ICD-10-CM | POA: Insufficient documentation

## 2017-12-08 HISTORY — DX: Malignant (primary) neoplasm, unspecified: C80.1

## 2017-12-08 HISTORY — DX: Chronic obstructive pulmonary disease, unspecified: J44.9

## 2017-12-08 HISTORY — DX: Personal history of urinary calculi: Z87.442

## 2017-12-08 LAB — URINALYSIS, ROUTINE W REFLEX MICROSCOPIC
BACTERIA UA: NONE SEEN
BILIRUBIN URINE: NEGATIVE
Glucose, UA: NEGATIVE mg/dL
KETONES UR: NEGATIVE mg/dL
LEUKOCYTES UA: NEGATIVE
Nitrite: NEGATIVE
PH: 5 (ref 5.0–8.0)
Protein, ur: NEGATIVE mg/dL
SPECIFIC GRAVITY, URINE: 1.016 (ref 1.005–1.030)

## 2017-12-08 LAB — BASIC METABOLIC PANEL
ANION GAP: 11 (ref 5–15)
BUN: 23 mg/dL — ABNORMAL HIGH (ref 6–20)
CALCIUM: 9.2 mg/dL (ref 8.9–10.3)
CO2: 26 mmol/L (ref 22–32)
CREATININE: 0.75 mg/dL (ref 0.44–1.00)
Chloride: 101 mmol/L (ref 101–111)
Glucose, Bld: 129 mg/dL — ABNORMAL HIGH (ref 65–99)
Potassium: 3.5 mmol/L (ref 3.5–5.1)
SODIUM: 138 mmol/L (ref 135–145)

## 2017-12-08 LAB — CBC
HCT: 47.3 % — ABNORMAL HIGH (ref 35.0–47.0)
HEMOGLOBIN: 15.7 g/dL (ref 12.0–16.0)
MCH: 31.8 pg (ref 26.0–34.0)
MCHC: 33.2 g/dL (ref 32.0–36.0)
MCV: 95.6 fL (ref 80.0–100.0)
PLATELETS: 309 10*3/uL (ref 150–440)
RBC: 4.94 MIL/uL (ref 3.80–5.20)
RDW: 16.1 % — AB (ref 11.5–14.5)
WBC: 10.6 10*3/uL (ref 3.6–11.0)

## 2017-12-08 LAB — PROTIME-INR
INR: 0.97
Prothrombin Time: 12.8 seconds (ref 11.4–15.2)

## 2017-12-08 LAB — SURGICAL PCR SCREEN
MRSA, PCR: NEGATIVE
STAPHYLOCOCCUS AUREUS: NEGATIVE

## 2017-12-08 LAB — DIFFERENTIAL
BASOS ABS: 0 10*3/uL (ref 0–0.1)
BASOS PCT: 0 %
EOS ABS: 0 10*3/uL (ref 0–0.7)
Eosinophils Relative: 0 %
Lymphocytes Relative: 5 %
Lymphs Abs: 0.6 10*3/uL — ABNORMAL LOW (ref 1.0–3.6)
Monocytes Absolute: 0.3 10*3/uL (ref 0.2–0.9)
Monocytes Relative: 3 %
NEUTROS PCT: 92 %
Neutro Abs: 9.7 10*3/uL — ABNORMAL HIGH (ref 1.4–6.5)

## 2017-12-08 LAB — APTT: APTT: 32 s (ref 24–36)

## 2017-12-08 NOTE — Pre-Procedure Instructions (Signed)
EKG OK BY DR PENWARDEN 

## 2017-12-08 NOTE — Patient Instructions (Addendum)
Your procedure is scheduled on: Wednesday 12/15/17 Report to Same Day Surgery 2nd floor medical mall [facility] Entrance-take elevator on left to 2nd floor.  Check in with surgery information desk.) To find out your arrival time please call [phone] between 1PM - 3PM on Tuesday 12/14/17  Remember: Instructions that are not followed completely may result in serious medical risk, up to and including death, or upon the discretion of your surgeon and anesthesiologist your surgery may need to be rescheduled.    _x___ 1. Do not eat food after midnight the night before your procedure. You may drink clear liquids up to 2 hours before you are scheduled to arrive at the hospital for your procedure.  Do not drink clear liquids within 2 hours of your scheduled arrival to the hospital.  Clear liquids include  --Water or Apple juice without pulp  --Clear carbohydrate beverage such as ClearFast or Gatorade  --Black Coffee or Clear Tea (No milk, no creamers, do not add anything to                  the coffee or Tea Type 1 and type 2 diabetics should only drink water.  No gum chewing or hard candies.     __x__ 2. No Alcohol for 24 hours before or after surgery.   __x__3. No Smoking or e-cigarettes for 24 prior to surgery.  Do not use any chewable tobacco products for at least 6 hour prior to surgery   ____  4. Bring all medications with you on the day of surgery if instructed.    __x__ 5. Notify your doctor if there is any change in your medical condition     (cold, fever, infections).    x___6. On the morning of surgery brush your teeth with toothpaste and water.  You may rinse your mouth with mouth wash if you wish.  Do not swallow any toothpaste or mouthwash.   Do not wear jewelry, make-up, hairpins, clips or nail polish.  Do not wear lotions, powders, or perfumes. You may wear deodorant.  Do not shave 48 hours prior to surgery. Men may shave face and neck.  Do not bring valuables to the  hospital.    [facility] is not responsible for any belongings or valuables.               Contacts, dentures or bridgework may not be worn into surgery.  Leave your suitcase in the car. After surgery it may be brought to your room.  For patients admitted to the hospital, discharge time is determined by your                       treatment team.  _  Patients discharged the day of surgery will not be allowed to drive home.  You will need someone to drive you home and stay with you the night of your procedure.    Please read over the following fact sheets that you were given:   [facility] Preparing for Surgery and or MRSA Information   _x___ Take anti-hypertensive listed below, cardiac, seizure, asthma,     anti-reflux and psychiatric medicines. These include:  1. Gabapentin  2. Protonix/Pantoprazole  3. Alprazolam/Xanax as needed  4. Hydrocodone/Norco as needed  5.  6.  ____Fleets enema or Magnesium Citrate as directed.   _x___ Use CHG Soap or sage wipes as directed on instruction sheet   _x___ Use inhalers on the day of surgery and  bring to hospital day of surgery  ____ Stop Metformin and Janumet 2 days prior to surgery.    ____ Take 1/2 of usual insulin dose the night before surgery and none on the morning     surgery.   _x___ Follow recommendations from Cardiologist, Pulmonologist or PCP regarding          stopping Aspirin, Coumadin, Plavix ,Eliquis, Effient, or Pradaxa, and Pletal.  X____Stop Anti-inflammatories such as Advil, Aleve, Ibuprofen, Motrin, Naproxen, Naprosyn, Goodies powders or aspirin products. OK to take Tylenol and                          Celebrex.   _x___ Stop supplements until after surgery.  But may continue Vitamin D, Vitamin B,       and multivitamin.   ____ Bring C-Pap to the hospital.

## 2017-12-09 NOTE — Pre-Procedure Instructions (Signed)
LABS FAXED  TO SURGEON 

## 2017-12-10 DIAGNOSIS — J449 Chronic obstructive pulmonary disease, unspecified: Secondary | ICD-10-CM | POA: Diagnosis not present

## 2017-12-10 DIAGNOSIS — J019 Acute sinusitis, unspecified: Secondary | ICD-10-CM | POA: Diagnosis not present

## 2017-12-13 DIAGNOSIS — M5416 Radiculopathy, lumbar region: Secondary | ICD-10-CM | POA: Diagnosis not present

## 2017-12-13 DIAGNOSIS — E559 Vitamin D deficiency, unspecified: Secondary | ICD-10-CM | POA: Diagnosis not present

## 2017-12-13 DIAGNOSIS — R634 Abnormal weight loss: Secondary | ICD-10-CM | POA: Diagnosis not present

## 2017-12-13 DIAGNOSIS — M48062 Spinal stenosis, lumbar region with neurogenic claudication: Secondary | ICD-10-CM | POA: Diagnosis not present

## 2017-12-15 ENCOUNTER — Ambulatory Visit: Payer: PPO

## 2017-12-15 ENCOUNTER — Ambulatory Visit: Payer: PPO | Admitting: Certified Registered Nurse Anesthetist

## 2017-12-15 ENCOUNTER — Other Ambulatory Visit: Payer: Self-pay

## 2017-12-15 ENCOUNTER — Encounter
Admission: RE | Disposition: A | Discharge status: Home / Self Care | Payer: Self-pay | Source: Ambulatory Visit | Attending: Neurosurgery

## 2017-12-15 ENCOUNTER — Observation Stay
Admission: RE | Admit: 2017-12-15 | Discharge: 2017-12-16 | Disposition: A | Discharge status: Home / Self Care | Payer: PPO | Source: Ambulatory Visit | Attending: Neurosurgery | Admitting: Neurosurgery

## 2017-12-15 DIAGNOSIS — Z8619 Personal history of other infectious and parasitic diseases: Secondary | ICD-10-CM | POA: Diagnosis not present

## 2017-12-15 DIAGNOSIS — Z800 Family history of malignant neoplasm of digestive organs: Secondary | ICD-10-CM | POA: Insufficient documentation

## 2017-12-15 DIAGNOSIS — F418 Other specified anxiety disorders: Secondary | ICD-10-CM | POA: Diagnosis not present

## 2017-12-15 DIAGNOSIS — Z888 Allergy status to other drugs, medicaments and biological substances status: Secondary | ICD-10-CM | POA: Diagnosis not present

## 2017-12-15 DIAGNOSIS — I739 Peripheral vascular disease, unspecified: Secondary | ICD-10-CM | POA: Diagnosis not present

## 2017-12-15 DIAGNOSIS — K449 Diaphragmatic hernia without obstruction or gangrene: Secondary | ICD-10-CM | POA: Diagnosis not present

## 2017-12-15 DIAGNOSIS — I728 Aneurysm of other specified arteries: Secondary | ICD-10-CM | POA: Diagnosis not present

## 2017-12-15 DIAGNOSIS — Z419 Encounter for procedure for purposes other than remedying health state, unspecified: Secondary | ICD-10-CM

## 2017-12-15 DIAGNOSIS — J449 Chronic obstructive pulmonary disease, unspecified: Secondary | ICD-10-CM | POA: Diagnosis not present

## 2017-12-15 DIAGNOSIS — Z87442 Personal history of urinary calculi: Secondary | ICD-10-CM | POA: Insufficient documentation

## 2017-12-15 DIAGNOSIS — I10 Essential (primary) hypertension: Secondary | ICD-10-CM | POA: Insufficient documentation

## 2017-12-15 DIAGNOSIS — Z881 Allergy status to other antibiotic agents status: Secondary | ICD-10-CM | POA: Diagnosis not present

## 2017-12-15 DIAGNOSIS — M5416 Radiculopathy, lumbar region: Secondary | ICD-10-CM | POA: Diagnosis not present

## 2017-12-15 DIAGNOSIS — E042 Nontoxic multinodular goiter: Secondary | ICD-10-CM | POA: Diagnosis not present

## 2017-12-15 DIAGNOSIS — M5116 Intervertebral disc disorders with radiculopathy, lumbar region: Secondary | ICD-10-CM | POA: Diagnosis not present

## 2017-12-15 DIAGNOSIS — G709 Myoneural disorder, unspecified: Secondary | ICD-10-CM | POA: Diagnosis not present

## 2017-12-15 DIAGNOSIS — Z9049 Acquired absence of other specified parts of digestive tract: Secondary | ICD-10-CM | POA: Insufficient documentation

## 2017-12-15 DIAGNOSIS — Z79899 Other long term (current) drug therapy: Secondary | ICD-10-CM | POA: Diagnosis not present

## 2017-12-15 DIAGNOSIS — Z8379 Family history of other diseases of the digestive system: Secondary | ICD-10-CM | POA: Insufficient documentation

## 2017-12-15 DIAGNOSIS — Z7951 Long term (current) use of inhaled steroids: Secondary | ICD-10-CM | POA: Diagnosis not present

## 2017-12-15 DIAGNOSIS — Z8249 Family history of ischemic heart disease and other diseases of the circulatory system: Secondary | ICD-10-CM | POA: Insufficient documentation

## 2017-12-15 DIAGNOSIS — Z833 Family history of diabetes mellitus: Secondary | ICD-10-CM | POA: Insufficient documentation

## 2017-12-15 DIAGNOSIS — M79604 Pain in right leg: Secondary | ICD-10-CM | POA: Diagnosis not present

## 2017-12-15 DIAGNOSIS — K219 Gastro-esophageal reflux disease without esophagitis: Secondary | ICD-10-CM | POA: Insufficient documentation

## 2017-12-15 DIAGNOSIS — K589 Irritable bowel syndrome without diarrhea: Secondary | ICD-10-CM | POA: Diagnosis not present

## 2017-12-15 DIAGNOSIS — E890 Postprocedural hypothyroidism: Secondary | ICD-10-CM | POA: Diagnosis not present

## 2017-12-15 DIAGNOSIS — Z981 Arthrodesis status: Secondary | ICD-10-CM | POA: Diagnosis not present

## 2017-12-15 HISTORY — PX: LUMBAR LAMINECTOMY/DECOMPRESSION MICRODISCECTOMY: SHX5026

## 2017-12-15 LAB — URINE DRUG SCREEN, QUALITATIVE (ARMC ONLY)
AMPHETAMINES, UR SCREEN: NOT DETECTED
Barbiturates, Ur Screen: NOT DETECTED
Benzodiazepine, Ur Scrn: POSITIVE — AB
COCAINE METABOLITE, UR ~~LOC~~: NOT DETECTED
Cannabinoid 50 Ng, Ur ~~LOC~~: POSITIVE — AB
MDMA (ECSTASY) UR SCREEN: NOT DETECTED
Methadone Scn, Ur: NOT DETECTED
Opiate, Ur Screen: POSITIVE — AB
Phencyclidine (PCP) Ur S: NOT DETECTED
TRICYCLIC, UR SCREEN: POSITIVE — AB

## 2017-12-15 LAB — ABO/RH: ABO/RH(D): A POS

## 2017-12-15 LAB — TYPE AND SCREEN
ABO/RH(D): A POS
Antibody Screen: NEGATIVE

## 2017-12-15 SURGERY — LUMBAR LAMINECTOMY/DECOMPRESSION MICRODISCECTOMY 1 LEVEL
Anesthesia: General | Site: Spine Lumbar | Laterality: Right | Wound class: Clean

## 2017-12-15 MED ORDER — PHENYLEPHRINE HCL 10 MG/ML IJ SOLN
INTRAMUSCULAR | Status: DC | PRN
  Administered 2017-12-15: 200 ug via INTRAVENOUS

## 2017-12-15 MED ORDER — PANTOPRAZOLE SODIUM 40 MG PO TBEC
40.0000 mg | DELAYED_RELEASE_TABLET | Freq: Every day | ORAL | Status: DC
  Administered 2017-12-16: 40 mg via ORAL
  Filled 2017-12-15: qty 1

## 2017-12-15 MED ORDER — SODIUM CHLORIDE 0.9 % IJ SOLN
INTRAMUSCULAR | Status: DC | PRN
  Administered 2017-12-15: 20 mL

## 2017-12-15 MED ORDER — ACETAMINOPHEN 500 MG PO TABS
1000.0000 mg | ORAL_TABLET | Freq: Four times a day (QID) | ORAL | Status: DC
  Filled 2017-12-15 (×2): qty 2

## 2017-12-15 MED ORDER — IPRATROPIUM-ALBUTEROL 0.5-2.5 (3) MG/3ML IN SOLN
3.0000 mL | Freq: Once | RESPIRATORY_TRACT | Status: AC
  Administered 2017-12-15: 3 mL via RESPIRATORY_TRACT

## 2017-12-15 MED ORDER — DEXAMETHASONE SODIUM PHOSPHATE 10 MG/ML IJ SOLN
INTRAMUSCULAR | Status: AC
  Filled 2017-12-15: qty 1

## 2017-12-15 MED ORDER — PROPOFOL 10 MG/ML IV BOLUS
INTRAVENOUS | Status: AC
  Filled 2017-12-15: qty 20

## 2017-12-15 MED ORDER — OXYCODONE HCL 5 MG PO TABS
5.0000 mg | ORAL_TABLET | ORAL | Status: DC | PRN
  Administered 2017-12-16: 5 mg via ORAL
  Filled 2017-12-15: qty 1

## 2017-12-15 MED ORDER — MENTHOL 3 MG MT LOZG: 1.0000 | LOZENGE | OROMUCOSAL | Status: DC | PRN

## 2017-12-15 MED ORDER — ACETAMINOPHEN 10 MG/ML IV SOLN
INTRAVENOUS | Status: AC
  Filled 2017-12-15: qty 100

## 2017-12-15 MED ORDER — BACITRACIN 50000 UNITS IM SOLR
INTRAMUSCULAR | Status: DC | PRN
  Administered 2017-12-15: 50000 [IU]

## 2017-12-15 MED ORDER — EPHEDRINE SULFATE 50 MG/ML IJ SOLN
INTRAMUSCULAR | Status: DC | PRN
  Administered 2017-12-15: 15 mg via INTRAVENOUS

## 2017-12-15 MED ORDER — ONDANSETRON HCL 4 MG/2ML IJ SOLN: 4.0000 mg | Freq: Four times a day (QID) | INTRAMUSCULAR | Status: DC | PRN

## 2017-12-15 MED ORDER — SENNOSIDES-DOCUSATE SODIUM 8.6-50 MG PO TABS: 1.0000 | ORAL_TABLET | Freq: Every evening | ORAL | Status: DC | PRN

## 2017-12-15 MED ORDER — SERTRALINE HCL 50 MG PO TABS
50.0000 mg | ORAL_TABLET | Freq: Every day | ORAL | Status: DC
  Administered 2017-12-15: 50 mg via ORAL
  Filled 2017-12-15: qty 1

## 2017-12-15 MED ORDER — FENTANYL CITRATE (PF) 100 MCG/2ML IJ SOLN
INTRAMUSCULAR | Status: DC | PRN
  Administered 2017-12-15: 50 ug via INTRAVENOUS
  Administered 2017-12-15: 100 ug via INTRAVENOUS

## 2017-12-15 MED ORDER — METHYLPREDNISOLONE ACETATE 40 MG/ML IJ SUSP
INTRAMUSCULAR | Status: DC | PRN
  Administered 2017-12-15: 40 mg

## 2017-12-15 MED ORDER — ROCURONIUM BROMIDE 50 MG/5ML IV SOLN
INTRAVENOUS | Status: AC
  Filled 2017-12-15: qty 1

## 2017-12-15 MED ORDER — PHENOL 1.4 % MT LIQD: 1.0000 | OROMUCOSAL | Status: DC | PRN

## 2017-12-15 MED ORDER — DEXTROMETHORPHAN POLISTIREX ER 30 MG/5ML PO SUER
60.0000 mg | Freq: Two times a day (BID) | ORAL | Status: DC
  Administered 2017-12-15 – 2017-12-16 (×2): 60 mg via ORAL
  Filled 2017-12-15 (×2): qty 10

## 2017-12-15 MED ORDER — LIDOCAINE HCL (PF) 2 % IJ SOLN
INTRAMUSCULAR | Status: AC
  Filled 2017-12-15: qty 10

## 2017-12-15 MED ORDER — SODIUM CHLORIDE 0.9 % IV SOLN
INTRAVENOUS | Status: DC
  Administered 2017-12-15: 18:00:00 via INTRAVENOUS

## 2017-12-15 MED ORDER — AMITRIPTYLINE HCL 25 MG PO TABS
50.0000 mg | ORAL_TABLET | Freq: Every day | ORAL | Status: DC
  Administered 2017-12-15: 50 mg via ORAL
  Filled 2017-12-15: qty 2

## 2017-12-15 MED ORDER — ALPRAZOLAM 0.5 MG PO TABS: 0.5000 mg | ORAL_TABLET | Freq: Two times a day (BID) | ORAL | Status: DC | PRN

## 2017-12-15 MED ORDER — PROPOFOL 10 MG/ML IV BOLUS
INTRAVENOUS | Status: DC | PRN
  Administered 2017-12-15: 100 mg via INTRAVENOUS

## 2017-12-15 MED ORDER — EVICEL 2 ML EX KIT
PACK | CUTANEOUS | Status: DC | PRN
  Administered 2017-12-15: 2 mL

## 2017-12-15 MED ORDER — THROMBIN (RECOMBINANT) 5000 UNITS EX SOLR
CUTANEOUS | Status: DC | PRN
  Administered 2017-12-15: 5000 [IU] via TOPICAL

## 2017-12-15 MED ORDER — ONDANSETRON HCL 4 MG/2ML IJ SOLN
INTRAMUSCULAR | Status: DC | PRN
  Administered 2017-12-15: 4 mg via INTRAVENOUS

## 2017-12-15 MED ORDER — MIDAZOLAM HCL 2 MG/2ML IJ SOLN
INTRAMUSCULAR | Status: DC | PRN
  Administered 2017-12-15: 2 mg via INTRAVENOUS

## 2017-12-15 MED ORDER — BUPIVACAINE LIPOSOME 1.3 % IJ SUSP
INTRAMUSCULAR | Status: DC | PRN
  Administered 2017-12-15: 20 mL

## 2017-12-15 MED ORDER — ONDANSETRON HCL 4 MG/2ML IJ SOLN
INTRAMUSCULAR | Status: AC
  Filled 2017-12-15: qty 2

## 2017-12-15 MED ORDER — LACTATED RINGERS IV SOLN
INTRAVENOUS | Status: DC
  Administered 2017-12-15 (×2): via INTRAVENOUS

## 2017-12-15 MED ORDER — ERYTHROMYCIN 5 MG/GM OP OINT
TOPICAL_OINTMENT | Freq: Three times a day (TID) | OPHTHALMIC | Status: DC
  Administered 2017-12-15: 1 via OPHTHALMIC
  Filled 2017-12-15: qty 3.5

## 2017-12-15 MED ORDER — GUAIFENESIN ER 600 MG PO TB12
1200.0000 mg | ORAL_TABLET | Freq: Two times a day (BID) | ORAL | Status: DC
  Administered 2017-12-15 – 2017-12-16 (×2): 1200 mg via ORAL
  Filled 2017-12-15 (×2): qty 2

## 2017-12-15 MED ORDER — PROPOFOL 500 MG/50ML IV EMUL
INTRAVENOUS | Status: DC | PRN
  Administered 2017-12-15: 140 ug/kg/min via INTRAVENOUS

## 2017-12-15 MED ORDER — SODIUM CHLORIDE 0.9 % IV SOLN
INTRAVENOUS | Status: DC | PRN
  Administered 2017-12-15: 20 ug/min via INTRAVENOUS

## 2017-12-15 MED ORDER — ACETAMINOPHEN 650 MG RE SUPP: 650.0000 mg | RECTAL | Status: DC | PRN

## 2017-12-15 MED ORDER — METHOCARBAMOL 1000 MG/10ML IJ SOLN: 500.0000 mg | Freq: Four times a day (QID) | INTRAVENOUS | Status: DC | PRN

## 2017-12-15 MED ORDER — SUCCINYLCHOLINE CHLORIDE 20 MG/ML IJ SOLN
INTRAMUSCULAR | Status: AC
  Filled 2017-12-15: qty 1

## 2017-12-15 MED ORDER — ACETAMINOPHEN 10 MG/ML IV SOLN
INTRAVENOUS | Status: DC | PRN
  Administered 2017-12-15: 1000 mg via INTRAVENOUS

## 2017-12-15 MED ORDER — SODIUM CHLORIDE 0.9% FLUSH: 3.0000 mL | INTRAVENOUS | Status: DC | PRN

## 2017-12-15 MED ORDER — SODIUM CHLORIDE 0.9 % IV SOLN: 250.0000 mL | INTRAVENOUS | Status: DC

## 2017-12-15 MED ORDER — FLUCONAZOLE 50 MG PO TABS
150.0000 mg | ORAL_TABLET | Freq: Every day | ORAL | Status: DC
  Administered 2017-12-16: 150 mg via ORAL
  Filled 2017-12-15: qty 1
  Filled 2017-12-15: qty 3

## 2017-12-15 MED ORDER — MIDAZOLAM HCL 2 MG/2ML IJ SOLN
INTRAMUSCULAR | Status: AC
  Filled 2017-12-15: qty 2

## 2017-12-15 MED ORDER — MINOCYCLINE HCL 50 MG PO CAPS
100.0000 mg | ORAL_CAPSULE | Freq: Two times a day (BID) | ORAL | Status: DC
  Administered 2017-12-15: 100 mg via ORAL
  Filled 2017-12-15: qty 2
  Filled 2017-12-15: qty 1

## 2017-12-15 MED ORDER — GABAPENTIN 300 MG PO CAPS
300.0000 mg | ORAL_CAPSULE | Freq: Two times a day (BID) | ORAL | Status: DC
  Administered 2017-12-15 – 2017-12-16 (×2): 300 mg via ORAL
  Filled 2017-12-15 (×2): qty 1

## 2017-12-15 MED ORDER — HYDROMORPHONE HCL 1 MG/ML IJ SOLN: 0.5000 mg | INTRAMUSCULAR | Status: DC | PRN

## 2017-12-15 MED ORDER — BUPIVACAINE-EPINEPHRINE (PF) 0.5% -1:200000 IJ SOLN
INTRAMUSCULAR | Status: DC | PRN
  Administered 2017-12-15: 7 mL

## 2017-12-15 MED ORDER — DM-GUAIFENESIN ER 30-600 MG PO TB12
2.0000 | ORAL_TABLET | Freq: Two times a day (BID) | ORAL | Status: DC
  Filled 2017-12-15: qty 2

## 2017-12-15 MED ORDER — SUCCINYLCHOLINE CHLORIDE 20 MG/ML IJ SOLN
INTRAMUSCULAR | Status: DC | PRN
  Administered 2017-12-15: 80 mg via INTRAVENOUS

## 2017-12-15 MED ORDER — ROCURONIUM BROMIDE 100 MG/10ML IV SOLN
INTRAVENOUS | Status: DC | PRN
  Administered 2017-12-15: 5 mg via INTRAVENOUS

## 2017-12-15 MED ORDER — IPRATROPIUM-ALBUTEROL 0.5-2.5 (3) MG/3ML IN SOLN
RESPIRATORY_TRACT | Status: AC
  Administered 2017-12-15: 3 mL via RESPIRATORY_TRACT
  Filled 2017-12-15: qty 3

## 2017-12-15 MED ORDER — ONDANSETRON HCL 4 MG PO TABS: 4.0000 mg | ORAL_TABLET | Freq: Four times a day (QID) | ORAL | Status: DC | PRN

## 2017-12-15 MED ORDER — SODIUM CHLORIDE 0.9% FLUSH
3.0000 mL | Freq: Two times a day (BID) | INTRAVENOUS | Status: DC
  Administered 2017-12-15: 3 mL via INTRAVENOUS

## 2017-12-15 MED ORDER — VANCOMYCIN HCL IN DEXTROSE 750-5 MG/150ML-% IV SOLN
750.0000 mg | Freq: Once | INTRAVENOUS | Status: AC
  Administered 2017-12-15: 750 mg via INTRAVENOUS
  Filled 2017-12-15: qty 150

## 2017-12-15 MED ORDER — PROPOFOL 500 MG/50ML IV EMUL
INTRAVENOUS | Status: AC
  Filled 2017-12-15: qty 50

## 2017-12-15 MED ORDER — BUPIVACAINE HCL 0.5 % IJ SOLN
INTRAMUSCULAR | Status: DC | PRN
  Administered 2017-12-15: 20 mL

## 2017-12-15 MED ORDER — METHOCARBAMOL 500 MG PO TABS: 500.0000 mg | ORAL_TABLET | Freq: Four times a day (QID) | ORAL | Status: DC | PRN

## 2017-12-15 MED ORDER — ACETAMINOPHEN 325 MG PO TABS: 650.0000 mg | ORAL_TABLET | ORAL | Status: DC | PRN

## 2017-12-15 MED ORDER — FENTANYL CITRATE (PF) 250 MCG/5ML IJ SOLN
INTRAMUSCULAR | Status: AC
  Filled 2017-12-15: qty 5

## 2017-12-15 MED ORDER — MOMETASONE FURO-FORMOTEROL FUM 200-5 MCG/ACT IN AERO
2.0000 | INHALATION_SPRAY | Freq: Two times a day (BID) | RESPIRATORY_TRACT | Status: DC
  Administered 2017-12-15 – 2017-12-16 (×2): 2 via RESPIRATORY_TRACT
  Filled 2017-12-15: qty 8.8

## 2017-12-15 MED ORDER — OXYCODONE HCL 5 MG PO TABS
10.0000 mg | ORAL_TABLET | ORAL | Status: DC | PRN
  Administered 2017-12-15 – 2017-12-16 (×2): 10 mg via ORAL
  Filled 2017-12-15 (×2): qty 2

## 2017-12-15 MED ORDER — LIDOCAINE HCL (CARDIAC) 20 MG/ML IV SOLN
INTRAVENOUS | Status: DC | PRN
  Administered 2017-12-15: 60 mg via INTRAVENOUS

## 2017-12-15 MED ORDER — DEXAMETHASONE SODIUM PHOSPHATE 10 MG/ML IJ SOLN
INTRAMUSCULAR | Status: DC | PRN
  Administered 2017-12-15: 10 mg via INTRAVENOUS

## 2017-12-15 MED ORDER — ALBUTEROL SULFATE (2.5 MG/3ML) 0.083% IN NEBU: 3.0000 mL | INHALATION_SOLUTION | Freq: Four times a day (QID) | RESPIRATORY_TRACT | Status: DC | PRN

## 2017-12-15 MED ORDER — SODIUM CHLORIDE FLUSH 0.9 % IV SOLN
INTRAVENOUS | Status: AC
  Filled 2017-12-15: qty 10

## 2017-12-15 MED ORDER — KETAMINE HCL 10 MG/ML IJ SOLN
INTRAMUSCULAR | Status: DC | PRN
  Administered 2017-12-15: 50 mg via INTRAVENOUS

## 2017-12-15 SURGICAL SUPPLY — 69 items
BLADE BOVIE TIP EXT 4 (BLADE) ×3 IMPLANT
BUR NEURO DRILL SOFT 3.0X3.8M (BURR) ×3 IMPLANT
CANISTER SUCT 1200ML W/VALVE (MISCELLANEOUS) ×6 IMPLANT
CHLORAPREP W/TINT 26ML (MISCELLANEOUS) ×6 IMPLANT
CNTNR SPEC 2.5X3XGRAD LEK (MISCELLANEOUS) ×1
CONT SPEC 4OZ STER OR WHT (MISCELLANEOUS) ×2
CONTAINER SPEC 2.5X3XGRAD LEK (MISCELLANEOUS) ×1 IMPLANT
COUNTER NEEDLE 20/40 LG (NEEDLE) ×3 IMPLANT
COVER LIGHT HANDLE STERIS (MISCELLANEOUS) ×6 IMPLANT
CUP MEDICINE 2OZ PLAST GRAD ST (MISCELLANEOUS) ×6 IMPLANT
DERMABOND ADVANCED (GAUZE/BANDAGES/DRESSINGS) ×2
DERMABOND ADVANCED .7 DNX12 (GAUZE/BANDAGES/DRESSINGS) ×1 IMPLANT
DRAPE C-ARM 42X72 X-RAY (DRAPES) ×6 IMPLANT
DRAPE LAPAROTOMY 100X77 ABD (DRAPES) ×3 IMPLANT
DRAPE MICROSCOPE SPINE 48X150 (DRAPES) ×3 IMPLANT
DRAPE POUCH INSTRU U-SHP 10X18 (DRAPES) ×3 IMPLANT
DRAPE SURG 17X11 SM STRL (DRAPES) ×12 IMPLANT
DRSG TEGADERM 4X4.75 (GAUZE/BANDAGES/DRESSINGS) IMPLANT
DRSG TELFA 4X3 1S NADH ST (GAUZE/BANDAGES/DRESSINGS) IMPLANT
ELECT CAUTERY BLADE TIP 2.5 (TIP) ×3
ELECT EZSTD 165MM 6.5IN (MISCELLANEOUS)
ELECT REM PT RETURN 9FT ADLT (ELECTROSURGICAL) ×3
ELECTRODE CAUTERY BLDE TIP 2.5 (TIP) ×1 IMPLANT
ELECTRODE EZSTD 165MM 6.5IN (MISCELLANEOUS) IMPLANT
ELECTRODE REM PT RTRN 9FT ADLT (ELECTROSURGICAL) ×1 IMPLANT
EVICEL AIRLESS SPRAY ACCES (MISCELLANEOUS) ×3 IMPLANT
FEE INTRAOP MONITOR IMPULS NCS (MISCELLANEOUS) ×1 IMPLANT
FRAME EYE SHIELD (PROTECTIVE WEAR) ×3 IMPLANT
GLOVE BIO SURGEON STRL SZ 6.5 (GLOVE) ×4 IMPLANT
GLOVE BIO SURGEONS STRL SZ 6.5 (GLOVE) ×2
GLOVE BIOGEL PI IND STRL 7.0 (GLOVE) ×2 IMPLANT
GLOVE BIOGEL PI INDICATOR 7.0 (GLOVE) ×4
GLOVE SURG SYN 8.5  E (GLOVE) ×6
GLOVE SURG SYN 8.5 E (GLOVE) ×3 IMPLANT
GOWN SRG XL LVL 3 NONREINFORCE (GOWNS) ×1 IMPLANT
GOWN STRL NON-REIN TWL XL LVL3 (GOWNS) ×2
GOWN STRL REUS W/ TWL LRG LVL3 (GOWN DISPOSABLE) ×1 IMPLANT
GOWN STRL REUS W/TWL LRG LVL3 (GOWN DISPOSABLE) ×2
GRADUATE 1200CC STRL 31836 (MISCELLANEOUS) ×3 IMPLANT
GRAFT DURAGEN MATRIX 1WX1L (Tissue) ×3 IMPLANT
INTRAOP MONITOR FEE IMPULS NCS (MISCELLANEOUS) ×1
INTRAOP MONITOR FEE IMPULSE (MISCELLANEOUS) ×2
KIT SPINAL PRONEVIEW (KITS) ×3 IMPLANT
KNIFE BAYONET SHORT DISCETOMY (MISCELLANEOUS) IMPLANT
MARKER SKIN DUAL TIP RULER LAB (MISCELLANEOUS) ×9 IMPLANT
NDL SAFETY ECLIPSE 18X1.5 (NEEDLE) ×1 IMPLANT
NEEDLE HYPO 18GX1.5 SHARP (NEEDLE) ×2
NEEDLE HYPO 22GX1.5 SAFETY (NEEDLE) ×3 IMPLANT
NS IRRIG 1000ML POUR BTL (IV SOLUTION) ×3 IMPLANT
PACK LAMINECTOMY NEURO (CUSTOM PROCEDURE TRAY) ×3 IMPLANT
PAD ARMBOARD 7.5X6 YLW CONV (MISCELLANEOUS) ×3 IMPLANT
PATTIES SURGICAL .5X1.5 (GAUZE/BANDAGES/DRESSINGS) IMPLANT
SPOGE SURGIFLO 8M (HEMOSTASIS) ×2
SPONGE SURGIFLO 8M (HEMOSTASIS) ×1 IMPLANT
STAPLER SKIN PROX 35W (STAPLE) IMPLANT
SUT DVC VLOC 3-0 CL 6 P-12 (SUTURE) ×3 IMPLANT
SUT NURALON 4 0 TR CR/8 (SUTURE) IMPLANT
SUT VIC AB 0 CT1 27 (SUTURE)
SUT VIC AB 0 CT1 27XCR 8 STRN (SUTURE) IMPLANT
SUT VIC AB 2-0 CT1 18 (SUTURE) ×3 IMPLANT
SUT VICRYL 0 AB UR-6 (SUTURE) ×3 IMPLANT
SYR 10ML LL (SYRINGE) ×3 IMPLANT
SYR 20CC LL (SYRINGE) ×3 IMPLANT
SYR 30ML LL (SYRINGE) ×6 IMPLANT
SYR 3ML LL SCALE MARK (SYRINGE) ×3 IMPLANT
TOWEL OR 17X26 4PK STRL BLUE (TOWEL DISPOSABLE) ×12 IMPLANT
TUBE METRX 18MMX5CM (INSTRUMENTS) ×3 IMPLANT
TUBING CONNECTING 10 (TUBING) ×2 IMPLANT
TUBING CONNECTING 10' (TUBING) ×1

## 2017-12-15 NOTE — H&P (Signed)
I have reviewed and confirmed my history and physical from 12/02/17 with no additions or changes. Plan for L4-5 discectomy.  Risks and benefits reviewed.   Heart sounds normal no MRG. Chest Clear to Auscultation Bilaterally.

## 2017-12-15 NOTE — Progress Notes (Signed)
Pharmacy Antibiotic Note  Tina Bradley is a 67 y.o. female admitted on (Not on file) with surgical prophylaxis.  Pharmacy has been consulted for vanc dosing.  Plan: Will dose patient vanc 15 mg/kg IV x 1 for surgical prophylaxis  Vanc 750 mg IV x 1 for pre-op    No data recorded.  Recent Labs  Lab 12/08/17 1108  WBC 10.6  CREATININE 0.75    Estimated Creatinine Clearance: 54.2 mL/min (by C-G formula based on SCr of 0.75 mg/dL).    Allergies  Allergen Reactions  . Augmentin [Amoxicillin-Pot Clavulanate] Other (See Comments)    She states upset stomach and diarrhea. Also blisters in mouth.  . Wellbutrin [Bupropion] Other (See Comments)    Bad dreams  . Propoxyphene Nausea And Vomiting and Rash    Thank you for allowing pharmacy to be a part of this patient's care.  Tobie Lords, PharmD, BCPS Clinical Pharmacist 12/15/2017

## 2017-12-15 NOTE — Op Note (Signed)
Indications: Mrs. 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  She failed conservative management and elected for surgical intervention.  Findings: large disc herniation at L4-5  Preoperative Diagnosis: Lumbar radiculopathy Postoperative Diagnosis: same   EBL: 20 ml IVF: 1100 ml Drains: none Disposition: Extubated and Stable to PACU Complications: none  No foley catheter was placed.   Preoperative Note:   Risks of surgery discussed include: infection, bleeding, stroke, coma, death, paralysis, CSF leak, nerve/spinal cord injury, numbness, tingling, weakness, complex regional pain syndrome, recurrent stenosis and/or disc herniation, vascular injury, development of instability, neck/back pain, need for further surgery, persistent symptoms, development of deformity, and the risks of anesthesia. The patient understood these risks and agreed to proceed.  Operative Note:    The patient was then brought from the preoperative center with intravenous access established.  The patient underwent general anesthesia and endotracheal tube intubation, and was then rotated on the [place] rail top where all pressure points were appropriately padded.  The skin was then thoroughly cleansed.  Perioperative antibiotic prophylaxis was administered.  Sterile prep and drapes were then applied and a timeout was then observed.  C-arm was brought into the field under sterile conditions, and the L4-5 disc space identified and marked with an incision on the right 1cm lateral to midline.  Once this was complete a 2 cm incision was opened with the use of a #10 blade knife.  The metrics tubes were sequentially advanced under lateral fluoroscopy until a 18 x 50 mm Metrix tube was placed over the facet and lamina and secured to the bed.    The microscope was then sterilely brought into the field and muscle creep was hemostased with a bipolar and resected with a pituitary rongeur.  A Bovie extender was  then used to expose the spinous process and lamina.  Careful attention was placed to not violate the facet capsule. A 3 mm matchstick drill bit was then used to make a hemi-laminotomy trough until the ligamentum flavum was exposed.  This was extended to the base of the spinous process.  Once this was complete and the underlying ligamentum flavum was visualized this was dissected with an up angle curette and resected with a #2 and #3 mm biting Kerrison.  The laminotomy opening was also expanded in similar fashion and hemostasis was obtained with Surgifoam and a patty as well as bone wax.  The rostral aspect of the caudal level of the lamina was also resected with a #2 biting Kerrison effort to further enhance exposure.  Once the underlying dura was visualized a Penfield 4 was then used to dissect and expose the traversing nerve root.  Once this was identified a nerve root retractor suction was used to mobilize this medially.  The venous plexus was hemostased with Surgifoam and light bipolar use.  A skinny Penfield 4 was then used to make a small annulotomy within the disc space and disc space contents were noted to to come through the annulus.    The disc herniation was identified and dissected free using a balltip probe. The pituitary rongeur was used to remove the extruded disc fragments. Once the thecal sac and nerve root were noted to be relaxed and under less tension the ball-tipped feeler was passed along the foramen distally to to ensure no residual compression was noted.    A Depo-Medrol soaked Gelfoam pledget was placed along the nerve root for 2 minutes and removed.  The area was irrigated. The tube system was then  removed under microscopic visualization and hemostasis was obtained with a bipolar.    The fascial layer was reapproximated with the use of a 0- Vicryl suture.  Subcutaneous tissue layer was reapproximated using 2-0 Vicryl suture.  3-0 monocryl was used on the skin. The skin was then  cleansed and Dermabond was used to close the skin opening.  Patient was then rotated back to the preoperative bed awakened from anesthesia and taken to recovery all counts are correct in this case.   I performed the entire procedure with the assistance of Marin Olp PA as an Pensions consultant.  Meade Maw MD

## 2017-12-15 NOTE — Anesthesia Procedure Notes (Signed)
Procedure Name: Intubation Date/Time: 12/15/2017 1:43 PM Performed by: Eben Burow, CRNA Pre-anesthesia Checklist: Patient identified, Emergency Drugs available, Suction available, Patient being monitored and Timeout performed Patient Re-evaluated:Patient Re-evaluated prior to induction Oxygen Delivery Method: Circle system utilized Preoxygenation: Pre-oxygenation with 100% oxygen Induction Type: IV induction Ventilation: Mask ventilation without difficulty Laryngoscope Size: Miller and 2 Grade View: Grade I Tube type: Oral Number of attempts: 2 Airway Equipment and Method: LTA kit utilized,  Bougie stylet and Video-laryngoscopy Placement Confirmation: ETT inserted through vocal cords under direct vision,  positive ETCO2 and breath sounds checked- equal and bilateral Secured at: 21 cm Tube secured with: Tape Dental Injury: Teeth and Oropharynx as per pre-operative assessment

## 2017-12-15 NOTE — Anesthesia Post-op Follow-up Note (Signed)
Anesthesia QCDR form completed.        

## 2017-12-15 NOTE — Transfer of Care (Signed)
Immediate Anesthesia Transfer of Care Note  Patient: Tina Bradley  Procedure(s) Performed: LUMBAR LAMINECTOMY/DECOMPRESSION MICRODISCECTOMY 1 LEVEL L4-5 (Right Spine Lumbar)  Patient Location: PACU  Anesthesia Type:General  Level of Consciousness: sedated  Airway & Oxygen Therapy: Patient Spontanous Breathing and Patient connected to face mask oxygen  Post-op Assessment: Report given to RN and Post -op Vital signs reviewed and stable  Post vital signs: Reviewed and stable  Last Vitals:  Vitals:   12/15/17 1138 12/15/17 1538  BP: 132/89 (P) 131/84  Pulse: 93   Resp: 18   Temp: (!) 36.3 C (P) 36.5 C  SpO2: 100%     Last Pain:  Vitals:   12/15/17 1138  TempSrc: Temporal  PainSc: 7          Complications: No apparent anesthesia complications

## 2017-12-16 ENCOUNTER — Encounter: Payer: Self-pay | Admitting: Neurosurgery

## 2017-12-16 DIAGNOSIS — M5116 Intervertebral disc disorders with radiculopathy, lumbar region: Secondary | ICD-10-CM | POA: Diagnosis not present

## 2017-12-16 MED ORDER — METHOCARBAMOL 500 MG PO TABS: 500.0000 mg | ORAL_TABLET | Freq: Four times a day (QID) | ORAL | 0 refills | Status: DC | PRN

## 2017-12-16 MED ORDER — OXYCODONE HCL 5 MG PO TABS: 5.0000 mg | ORAL_TABLET | ORAL | 0 refills | Status: DC | PRN

## 2017-12-16 NOTE — Anesthesia Postprocedure Evaluation (Signed)
Anesthesia Post Note  Patient: Tina Bradley  Procedure(s) Performed: LUMBAR LAMINECTOMY/DECOMPRESSION MICRODISCECTOMY 1 LEVEL L4-5 (Right Spine Lumbar)  Patient location during evaluation: PACU Anesthesia Type: General Level of consciousness: awake and alert Pain management: pain level controlled Vital Signs Assessment: post-procedure vital signs reviewed and stable Respiratory status: spontaneous breathing, nonlabored ventilation, respiratory function stable and patient connected to nasal cannula oxygen Cardiovascular status: blood pressure returned to baseline and stable Postop Assessment: no apparent nausea or vomiting Anesthetic complications: no     Last Vitals:  Vitals:   12/16/17 0435 12/16/17 0729  BP: (!) 155/84 (!) 162/82  Pulse: 70 74  Resp: 16   Temp: (!) 36.3 C 36.6 C  SpO2: 100% 100%    Last Pain:  Vitals:   12/16/17 0841  TempSrc:   PainSc: [name]

## 2017-12-16 NOTE — Discharge Instructions (Signed)

## 2017-12-16 NOTE — Discharge Summary (Signed)
  History: Tina Bradley is POD1 s/p L4/5 decompression. Patient tolerated procedure well without complication. Pain present prior to surgery remains resolved. Back pain minimal at incision site. Denies extremity pain. Complains of persistent numbness in right lower extremity but this is improving. Ambulated, voided, and eaten without issue. Pain controlled with oxycodone, tylenol, and robaxin. No other complaints.  Physical Exam: Vitals:   12/16/17 0435 12/16/17 0729  BP: (!) 155/84 (!) 162/82  Pulse: 70 74  Resp: 16   Temp: (!) 97.4 F (36.3 C) 97.8 F (36.6 C)  SpO2: 100% 100%    AA Ox3 CNI Skin: incision site intact. Glue present  Strength:4+/5 diffusely through right lower extremity with decreased sensation   Data:  No results for input(s): NA, K, CL, CO2, BUN, CREATININE, LABGLOM, GLUCOSE, CALCIUM in the last 168 hours. No results for input(s): AST, ALT, ALKPHOS in the last 168 hours.  Invalid input(s): TBILI   No results for input(s): WBC, HGB, HCT, PLT in the last 168 hours. No results for input(s): APTT, INR in the last 168 hours.       Assessment/Plan:  Tina Bradley is recovering well s/p L4-5 decompression. Post op activity restrictions reviewed. Pain will continue to be controlled with oxycodone, robaxin, and tylenol. All questions and concerns were addressed. Patient will follow up in 2 weeks to monitor progress. Advised to contact office if additional questions or concerns arise.  Marin Olp PA-C Department of Neurosurgery

## 2017-12-16 NOTE — Anesthesia Preprocedure Evaluation (Signed)
Anesthesia Evaluation  Patient identified by MRN, date of birth, ID band Patient awake    Reviewed: Allergy & Precautions, H&P , NPO status , Patient's Chart, lab work & pertinent test results, reviewed documented beta blocker date and time   Airway Mallampati: II   Neck ROM: full    Dental  (+) Poor Dentition   Pulmonary neg pulmonary ROS, COPD, Current Smoker,    Pulmonary exam normal        Cardiovascular Exercise Tolerance: Poor hypertension, On Medications + Peripheral Vascular Disease  negative cardio ROS Normal cardiovascular exam Rhythm:regular Rate:Normal     Neuro/Psych PSYCHIATRIC DISORDERS Anxiety Depression  Neuromuscular disease negative neurological ROS  negative psych ROS   GI/Hepatic negative GI ROS, Neg liver ROS, hiatal hernia, GERD  Medicated,(+) Hepatitis -  Endo/Other  negative endocrine ROS  Renal/GU negative Renal ROS  negative genitourinary   Musculoskeletal   Abdominal   Peds  Hematology negative hematology ROS (+) anemia ,   Anesthesia Other Findings Past Medical History: No date: Anemia No date: Aneurysm of splenic artery (HCC) No date: Aneurysm of splenic artery (HCC) No date: Anxiety No date: Back pain No date: Cancer [facility])     Comment:  skin cancer 2018 No date: COPD (chronic obstructive pulmonary disease) (HCC) No date: Depression No date: Gastritis No date: GERD (gastroesophageal reflux disease) No date: Gout No date: Hepatitis     Comment:  c w/o coma No date: Hip fracture (HCC) No date: History of hiatal hernia No date: History of kidney stones No date: Hypertension No date: IBS (irritable bowel syndrome) No date: Liver disease No date: Lumbago No date: Multinodular goiter No date: Neck fracture (HCC) No date: Splenic artery aneurysm (HCC) Past Surgical History: No date: APPENDECTOMY No date: BREAST CYST ASPIRATION No date: CARPAL TUNNEL RELEASE No date:  CHOLECYSTECTOMY 08/07/2016: COLONOSCOPY; N/A     Comment:  Procedure: COLONOSCOPY;  Surgeon: Lollie Sails, MD;              Location: [facility] ENDOSCOPY;  Service: Endoscopy;                Laterality: N/A; 03/06/2016: ESOPHAGOGASTRODUODENOSCOPY (EGD) WITH PROPOFOL; N/A     Comment:  Procedure: ESOPHAGOGASTRODUODENOSCOPY (EGD) WITH               PROPOFOL;  Surgeon: Hulen Luster, MD;  Location: [facility]               ENDOSCOPY;  Service: Gastroenterology;  Laterality: N/A; No date: HERNIA REPAIR No date: THYROID SURGERY No date: TONSILLECTOMY No date: TUBAL LIGATION BMI    Body Mass Index:  20.03 kg/m     Reproductive/Obstetrics negative OB ROS                             Anesthesia Physical Anesthesia Plan  ASA: III  Anesthesia Plan: General   Post-op Pain Management:    Induction:   PONV Risk Score and Plan:   Airway Management Planned:   Additional Equipment:   Intra-op Plan:   Post-operative Plan:   Informed Consent: I have reviewed the patients History and Physical, chart, labs and discussed the procedure including the risks, benefits and alternatives for the proposed anesthesia with the patient or authorized representative who has indicated his/her understanding and acceptance.   Dental Advisory Given  Plan Discussed with: CRNA  Anesthesia Plan Comments:         Anesthesia Quick Evaluation

## 2017-12-16 NOTE — Plan of Care (Signed)
  Education: Knowledge of General Education information will improve 12/16/2017 [phone] - Progressing by Terrea Bruster, Lucille Passy, RN   Health Behavior/Discharge Planning: Ability to manage health-related needs will improve 12/16/2017 0339 - Progressing by Deboraha Goar, Lucille Passy, RN   Clinical Measurements: Ability to maintain clinical measurements within normal limits will improve 12/16/2017 0339 - Progressing by Zerina Hallinan, Lucille Passy, RN Will remain free from infection 12/16/2017 0339 - Progressing by Christhoper Busbee, Lucille Passy, RN Diagnostic test results will improve 12/16/2017 [phone] - Progressing by Chuong Casebeer, Lucille Passy, RN Respiratory complications will improve 12/16/2017 0339 - Progressing by Bryna Colander, RN Cardiovascular complication will be avoided 12/16/2017 0339 - Progressing by Zahi Plaskett, Lucille Passy, RN   Activity: Risk for activity intolerance will decrease 12/16/2017 0339 - Progressing by Bryna Colander, RN   Nutrition: Adequate nutrition will be maintained 12/16/2017 6389 - Progressing by Bryna Colander, RN   Coping: Level of anxiety will decrease 12/16/2017 0339 - Progressing by Bryna Colander, RN   Elimination: Will not experience complications related to bowel motility 12/16/2017 0339 - Progressing by Bryna Colander, RN Will not experience complications related to urinary retention 12/16/2017 0339 - Progressing by Nelly Scriven, Lucille Passy, RN   Pain Managment: General experience of comfort will improve 12/16/2017 0339 - Progressing by Ko Bardon, Lucille Passy, RN

## 2018-01-26 DIAGNOSIS — M5441 Lumbago with sciatica, right side: Secondary | ICD-10-CM | POA: Diagnosis not present

## 2018-01-26 DIAGNOSIS — Z9889 Other specified postprocedural states: Secondary | ICD-10-CM | POA: Diagnosis not present

## 2018-01-26 DIAGNOSIS — G8929 Other chronic pain: Secondary | ICD-10-CM | POA: Diagnosis not present

## 2018-01-31 DIAGNOSIS — M5441 Lumbago with sciatica, right side: Secondary | ICD-10-CM | POA: Diagnosis not present

## 2018-01-31 DIAGNOSIS — Z9889 Other specified postprocedural states: Secondary | ICD-10-CM | POA: Diagnosis not present

## 2018-02-02 DIAGNOSIS — M5441 Lumbago with sciatica, right side: Secondary | ICD-10-CM | POA: Diagnosis not present

## 2018-02-02 DIAGNOSIS — Z9889 Other specified postprocedural states: Secondary | ICD-10-CM | POA: Diagnosis not present

## 2018-02-08 DIAGNOSIS — Z9889 Other specified postprocedural states: Secondary | ICD-10-CM | POA: Diagnosis not present

## 2018-02-08 DIAGNOSIS — M5441 Lumbago with sciatica, right side: Secondary | ICD-10-CM | POA: Diagnosis not present

## 2018-02-10 DIAGNOSIS — G8929 Other chronic pain: Secondary | ICD-10-CM | POA: Diagnosis not present

## 2018-02-10 DIAGNOSIS — M7611 Psoas tendinitis, right hip: Secondary | ICD-10-CM | POA: Diagnosis not present

## 2018-02-10 DIAGNOSIS — M25551 Pain in right hip: Secondary | ICD-10-CM | POA: Diagnosis not present

## 2018-02-10 DIAGNOSIS — R634 Abnormal weight loss: Secondary | ICD-10-CM | POA: Diagnosis not present

## 2018-02-10 DIAGNOSIS — M5416 Radiculopathy, lumbar region: Secondary | ICD-10-CM | POA: Diagnosis not present

## 2018-02-10 DIAGNOSIS — M48062 Spinal stenosis, lumbar region with neurogenic claudication: Secondary | ICD-10-CM | POA: Diagnosis not present

## 2018-02-10 DIAGNOSIS — E559 Vitamin D deficiency, unspecified: Secondary | ICD-10-CM | POA: Diagnosis not present

## 2018-02-10 DIAGNOSIS — I10 Essential (primary) hypertension: Secondary | ICD-10-CM | POA: Diagnosis not present

## 2018-02-11 DIAGNOSIS — Z9889 Other specified postprocedural states: Secondary | ICD-10-CM | POA: Diagnosis not present

## 2018-02-11 DIAGNOSIS — M5441 Lumbago with sciatica, right side: Secondary | ICD-10-CM | POA: Diagnosis not present

## 2018-02-16 DIAGNOSIS — R634 Abnormal weight loss: Secondary | ICD-10-CM | POA: Diagnosis not present

## 2018-02-16 DIAGNOSIS — Z9889 Other specified postprocedural states: Secondary | ICD-10-CM | POA: Diagnosis not present

## 2018-02-16 DIAGNOSIS — M545 Low back pain: Secondary | ICD-10-CM | POA: Diagnosis not present

## 2018-02-16 DIAGNOSIS — M5441 Lumbago with sciatica, right side: Secondary | ICD-10-CM | POA: Diagnosis not present

## 2018-02-16 DIAGNOSIS — K589 Irritable bowel syndrome without diarrhea: Secondary | ICD-10-CM | POA: Diagnosis not present

## 2018-02-16 DIAGNOSIS — F329 Major depressive disorder, single episode, unspecified: Secondary | ICD-10-CM | POA: Diagnosis not present

## 2018-02-16 DIAGNOSIS — G8929 Other chronic pain: Secondary | ICD-10-CM | POA: Diagnosis not present

## 2018-02-22 DIAGNOSIS — Z9889 Other specified postprocedural states: Secondary | ICD-10-CM | POA: Diagnosis not present

## 2018-02-22 DIAGNOSIS — M5441 Lumbago with sciatica, right side: Secondary | ICD-10-CM | POA: Diagnosis not present

## 2018-02-23 DIAGNOSIS — K219 Gastro-esophageal reflux disease without esophagitis: Secondary | ICD-10-CM | POA: Diagnosis not present

## 2018-02-23 DIAGNOSIS — R197 Diarrhea, unspecified: Secondary | ICD-10-CM | POA: Diagnosis not present

## 2018-02-23 DIAGNOSIS — R634 Abnormal weight loss: Secondary | ICD-10-CM | POA: Diagnosis not present

## 2018-02-25 ENCOUNTER — Other Ambulatory Visit
Admission: RE | Admit: 2018-02-25 | Discharge: 2018-02-25 | Disposition: A | Discharge status: Home / Self Care | Payer: PPO | Source: Ambulatory Visit | Attending: Gastroenterology | Admitting: Gastroenterology

## 2018-02-25 DIAGNOSIS — R197 Diarrhea, unspecified: Secondary | ICD-10-CM | POA: Diagnosis not present

## 2018-02-25 LAB — C DIFFICILE QUICK SCREEN W PCR REFLEX
C Diff antigen: NEGATIVE
C Diff interpretation: NOT DETECTED
C Diff toxin: NEGATIVE

## 2018-02-25 LAB — GASTROINTESTINAL PANEL BY PCR, STOOL (REPLACES STOOL CULTURE)
ADENOVIRUS F40/41: NOT DETECTED
ASTROVIRUS: NOT DETECTED
CAMPYLOBACTER SPECIES: NOT DETECTED
CYCLOSPORA CAYETANENSIS: NOT DETECTED
Cryptosporidium: NOT DETECTED
ENTEROTOXIGENIC E COLI (ETEC): NOT DETECTED
Entamoeba histolytica: NOT DETECTED
Enteroaggregative E coli (EAEC): NOT DETECTED
Enteropathogenic E coli (EPEC): NOT DETECTED
Giardia lamblia: NOT DETECTED
Norovirus GI/GII: NOT DETECTED
PLESIMONAS SHIGELLOIDES: NOT DETECTED
Rotavirus A: NOT DETECTED
Salmonella species: NOT DETECTED
Sapovirus (I, II, IV, and V): NOT DETECTED
Shiga like toxin producing E coli (STEC): NOT DETECTED
Shigella/Enteroinvasive E coli (EIEC): NOT DETECTED
VIBRIO SPECIES: NOT DETECTED
Vibrio cholerae: NOT DETECTED
Yersinia enterocolitica: NOT DETECTED

## 2018-03-01 DIAGNOSIS — G8929 Other chronic pain: Secondary | ICD-10-CM | POA: Diagnosis not present

## 2018-03-01 DIAGNOSIS — M25551 Pain in right hip: Secondary | ICD-10-CM | POA: Diagnosis not present

## 2018-03-01 DIAGNOSIS — M7611 Psoas tendinitis, right hip: Secondary | ICD-10-CM | POA: Diagnosis not present

## 2018-03-24 DIAGNOSIS — Z9889 Other specified postprocedural states: Secondary | ICD-10-CM | POA: Diagnosis not present

## 2018-04-04 DIAGNOSIS — R110 Nausea: Secondary | ICD-10-CM | POA: Diagnosis not present

## 2018-04-04 DIAGNOSIS — R634 Abnormal weight loss: Secondary | ICD-10-CM | POA: Diagnosis not present

## 2018-04-04 DIAGNOSIS — R194 Change in bowel habit: Secondary | ICD-10-CM | POA: Diagnosis not present

## 2018-04-05 ENCOUNTER — Other Ambulatory Visit: Payer: Self-pay | Admitting: Gastroenterology

## 2018-04-05 DIAGNOSIS — R194 Change in bowel habit: Secondary | ICD-10-CM

## 2018-04-05 DIAGNOSIS — R110 Nausea: Secondary | ICD-10-CM

## 2018-04-05 DIAGNOSIS — R634 Abnormal weight loss: Secondary | ICD-10-CM

## 2018-04-12 DIAGNOSIS — M25551 Pain in right hip: Secondary | ICD-10-CM | POA: Diagnosis not present

## 2018-04-12 DIAGNOSIS — G8929 Other chronic pain: Secondary | ICD-10-CM | POA: Diagnosis not present

## 2018-04-12 DIAGNOSIS — M7611 Psoas tendinitis, right hip: Secondary | ICD-10-CM | POA: Diagnosis not present

## 2018-04-27 ENCOUNTER — Ambulatory Visit
Admission: RE | Admit: 2018-04-27 | Discharge: 2018-04-27 | Disposition: A | Discharge status: Home / Self Care | Payer: PPO | Source: Ambulatory Visit | Attending: Gastroenterology | Admitting: Gastroenterology

## 2018-04-27 DIAGNOSIS — R634 Abnormal weight loss: Secondary | ICD-10-CM

## 2018-04-27 DIAGNOSIS — R110 Nausea: Secondary | ICD-10-CM

## 2018-04-27 DIAGNOSIS — R194 Change in bowel habit: Secondary | ICD-10-CM

## 2018-04-27 DIAGNOSIS — K635 Polyp of colon: Secondary | ICD-10-CM | POA: Diagnosis not present

## 2018-05-30 ENCOUNTER — Other Ambulatory Visit: Payer: Self-pay

## 2018-05-30 NOTE — Patient Outreach (Signed)
[facility]) Care Management  05/30/2018  Tina Bradley January 06, 1951 [id number]   Telephone call to review health risk assessment and screen for care management needs for  [employer]. No answer and HIPAA compliant message left.  Member returned call.  States her only health issue that she deals with is her irritable bowel and it is doing OK now.  States she is followed by her doctor regularly.  States she would like some information sent to her on living wills and how to stop smoking.    Telephone screening call completed to review members health risk assessment.   Member agrees to have information on Advanced Directives and Smoking cessation sent to her Member assessed with no further intervention needed. Plan to send successful outreach letter with program brochure and 24 hr nurse line magnet.  Peter Garter RN, Jackquline Denmark, CDE Care Management Coordinator [facility] Management [phone]

## 2018-06-02 DIAGNOSIS — L249 Irritant contact dermatitis, unspecified cause: Secondary | ICD-10-CM | POA: Diagnosis not present

## 2018-07-06 ENCOUNTER — Encounter: Payer: Self-pay | Admitting: *Deleted

## 2018-07-07 ENCOUNTER — Ambulatory Visit: Payer: PPO | Admitting: Anesthesiology

## 2018-07-07 ENCOUNTER — Encounter
Admission: RE | Disposition: A | Discharge status: Home / Self Care | Payer: Self-pay | Source: Ambulatory Visit | Attending: Gastroenterology

## 2018-07-07 ENCOUNTER — Ambulatory Visit
Admission: RE | Admit: 2018-07-07 | Discharge: 2018-07-07 | Disposition: A | Discharge status: Home / Self Care | Payer: PPO | Source: Ambulatory Visit | Attending: Gastroenterology | Admitting: Gastroenterology

## 2018-07-07 DIAGNOSIS — I10 Essential (primary) hypertension: Secondary | ICD-10-CM | POA: Diagnosis not present

## 2018-07-07 DIAGNOSIS — Z85828 Personal history of other malignant neoplasm of skin: Secondary | ICD-10-CM | POA: Diagnosis not present

## 2018-07-07 DIAGNOSIS — R194 Change in bowel habit: Secondary | ICD-10-CM | POA: Diagnosis not present

## 2018-07-07 DIAGNOSIS — Z79899 Other long term (current) drug therapy: Secondary | ICD-10-CM | POA: Insufficient documentation

## 2018-07-07 DIAGNOSIS — Z87442 Personal history of urinary calculi: Secondary | ICD-10-CM | POA: Diagnosis not present

## 2018-07-07 DIAGNOSIS — I739 Peripheral vascular disease, unspecified: Secondary | ICD-10-CM | POA: Diagnosis not present

## 2018-07-07 DIAGNOSIS — Z880 Allergy status to penicillin: Secondary | ICD-10-CM | POA: Diagnosis not present

## 2018-07-07 DIAGNOSIS — Z888 Allergy status to other drugs, medicaments and biological substances status: Secondary | ICD-10-CM | POA: Diagnosis not present

## 2018-07-07 DIAGNOSIS — F419 Anxiety disorder, unspecified: Secondary | ICD-10-CM | POA: Diagnosis not present

## 2018-07-07 DIAGNOSIS — F329 Major depressive disorder, single episode, unspecified: Secondary | ICD-10-CM | POA: Diagnosis not present

## 2018-07-07 DIAGNOSIS — B1920 Unspecified viral hepatitis C without hepatic coma: Secondary | ICD-10-CM | POA: Diagnosis not present

## 2018-07-07 DIAGNOSIS — R195 Other fecal abnormalities: Secondary | ICD-10-CM | POA: Insufficient documentation

## 2018-07-07 DIAGNOSIS — I728 Aneurysm of other specified arteries: Secondary | ICD-10-CM | POA: Insufficient documentation

## 2018-07-07 DIAGNOSIS — K589 Irritable bowel syndrome without diarrhea: Secondary | ICD-10-CM | POA: Diagnosis not present

## 2018-07-07 DIAGNOSIS — Z538 Procedure and treatment not carried out for other reasons: Secondary | ICD-10-CM | POA: Insufficient documentation

## 2018-07-07 DIAGNOSIS — R634 Abnormal weight loss: Secondary | ICD-10-CM | POA: Insufficient documentation

## 2018-07-07 DIAGNOSIS — R933 Abnormal findings on diagnostic imaging of other parts of digestive tract: Secondary | ICD-10-CM | POA: Diagnosis not present

## 2018-07-07 DIAGNOSIS — K56699 Other intestinal obstruction unspecified as to partial versus complete obstruction: Secondary | ICD-10-CM | POA: Insufficient documentation

## 2018-07-07 DIAGNOSIS — J449 Chronic obstructive pulmonary disease, unspecified: Secondary | ICD-10-CM | POA: Insufficient documentation

## 2018-07-07 DIAGNOSIS — K5730 Diverticulosis of large intestine without perforation or abscess without bleeding: Secondary | ICD-10-CM | POA: Insufficient documentation

## 2018-07-07 DIAGNOSIS — K5790 Diverticulosis of intestine, part unspecified, without perforation or abscess without bleeding: Secondary | ICD-10-CM | POA: Diagnosis not present

## 2018-07-07 DIAGNOSIS — K219 Gastro-esophageal reflux disease without esophagitis: Secondary | ICD-10-CM | POA: Insufficient documentation

## 2018-07-07 DIAGNOSIS — R110 Nausea: Secondary | ICD-10-CM | POA: Diagnosis not present

## 2018-07-07 HISTORY — PX: COLONOSCOPY WITH PROPOFOL: SHX5780

## 2018-07-07 SURGERY — COLONOSCOPY WITH PROPOFOL
Anesthesia: General

## 2018-07-07 MED ORDER — SODIUM CHLORIDE 0.9 % IV SOLN
INTRAVENOUS | Status: DC
  Administered 2018-07-07: 1000 mL via INTRAVENOUS

## 2018-07-07 MED ORDER — PROPOFOL 10 MG/ML IV BOLUS
INTRAVENOUS | Status: DC | PRN
  Administered 2018-07-07 (×2): 30 mg via INTRAVENOUS
  Administered 2018-07-07: 40 mg via INTRAVENOUS
  Administered 2018-07-07 (×2): 30 mg via INTRAVENOUS

## 2018-07-07 MED ORDER — SODIUM CHLORIDE 0.9 % IV SOLN
INTRAVENOUS | Status: DC
  Administered 2018-07-07: 12:00:00 via INTRAVENOUS

## 2018-07-07 MED ORDER — PROPOFOL 500 MG/50ML IV EMUL
INTRAVENOUS | Status: DC | PRN
  Administered 2018-07-07: 125 ug/kg/min via INTRAVENOUS

## 2018-07-07 NOTE — Anesthesia Post-op Follow-up Note (Signed)
Anesthesia QCDR form completed.        

## 2018-07-07 NOTE — Transfer of Care (Signed)
Immediate Anesthesia Transfer of Care Note  Patient: Tina Bradley  Procedure(s) Performed: COLONOSCOPY WITH PROPOFOL (N/A )  Patient Location: PACU  Anesthesia Type:General  Level of Consciousness: sedated  Airway & Oxygen Therapy: Patient Spontanous Breathing and Patient connected to nasal cannula oxygen  Post-op Assessment: Report given to RN  Post vital signs: Reviewed and stable  Last Vitals:  Vitals Value Taken Time  BP 111/62 07/07/2018 12:14 PM  Temp 36.1 C 07/07/2018 12:14 PM  Pulse 82 07/07/2018 12:19 PM  Resp 18 07/07/2018 12:19 PM  SpO2 100 % 07/07/2018 12:19 PM  Vitals shown include unvalidated device data.  Last Pain:  Vitals:   07/07/18 1214  TempSrc: Tympanic  PainSc: Asleep         Complications: No apparent anesthesia complications

## 2018-07-07 NOTE — Op Note (Signed)
[facility] Patient Name: Tina Bradley Procedure Date: 07/07/2018 11:33 AM MRN: [id number] Account #: [id number] Date of Birth: 08-17-1951 Admit Type: Outpatient Age: 67 Room: [facility] ENDO ROOM 1 Gender: Female Note Status: Finalized Procedure:            Colonoscopy Indications:          Abnormal virtual colonoscopy Providers:            Lollie Sails, MD Referring MD:         Adrian Prows (Referring MD) Medicines:            Monitored Anesthesia Care Complications:        No immediate complications. Procedure:            Pre-Anesthesia Assessment:                       - ASA Grade Assessment: III - A patient with severe                        systemic disease.                       After obtaining informed consent, the colonoscope was                        passed under direct vision. Throughout the procedure,                        the patient's blood pressure, pulse, and oxygen                        saturations were monitored continuously. The                        Colonoscope was introduced through the anus with the                        intention of advancing to the cecum. The scope was                        advanced to the sigmoid colon before the procedure was                        aborted. Medications were given. The colonoscopy was                        extremely difficult due to unusual anatomy. The                        colonoscopy was unusually difficult. Findings:      The scope was advanced to about 24-25 cm from the anal verge. At this       point there is a sharp angulation that prevents further movement of the       scope. There is apparent tethering of the colon that prevents dials from       moving very far. I tried repositioning the patient and support without       success.      A few medium-mouthed diverticula were found in the distal sigmoid colon. Impression:           - Diverticulosis in  the distal sigmoid  colon.                       - No specimens collected. Recommendation:       - Discharge patient to home .                       - Refer to a gastroenterologist/tertiary care at                        appointment to be scheduled. Lollie Sails, MD 07/07/2018 12:26:06 PM This report has been signed electronically. Number of Addenda: 0 Note Initiated On: 07/07/2018 11:33 AM Total Procedure Duration: 0 hours 17 minutes 59 seconds       [facility] [address]

## 2018-07-07 NOTE — Anesthesia Preprocedure Evaluation (Addendum)
Anesthesia Evaluation  Patient identified by MRN, date of birth, ID band Patient awake    Reviewed: Allergy & Precautions, H&P , NPO status , Patient's Chart, lab work & pertinent test results  Airway Mallampati: II  TM Distance: >3 FB Neck ROM: full    Dental  (+) Missing, Chipped, Loose   Pulmonary COPD, Current Smoker,    breath sounds clear to auscultation       Cardiovascular hypertension, (-) angina+ Peripheral Vascular Disease  (-) Past MI, (-) Cardiac Stents and (-) CABG (-) dysrhythmias  Rhythm:regular Rate:Normal     Neuro/Psych PSYCHIATRIC DISORDERS Anxiety Depression  Neuromuscular disease negative neurological ROS  negative psych ROS   GI/Hepatic Neg liver ROS, hiatal hernia, GERD  ,(+) Hepatitis -  Endo/Other  negative endocrine ROS  Renal/GU negative Renal ROS  negative genitourinary   Musculoskeletal   Abdominal   Peds  Hematology negative hematology ROS (+) anemia ,   Anesthesia Other Findings Past Medical History: No date: Anemia No date: Aneurysm of splenic artery (HCC) No date: Aneurysm of splenic artery (HCC) No date: Anxiety No date: Back pain No date: Cancer [facility])     Comment:  skin cancer 2018 No date: COPD (chronic obstructive pulmonary disease) (HCC) No date: Depression No date: Gastritis No date: GERD (gastroesophageal reflux disease) No date: Gout No date: Hepatitis     Comment:  c w/o coma No date: Hip fracture (HCC) No date: History of hiatal hernia No date: History of kidney stones No date: Hypertension No date: IBS (irritable bowel syndrome) No date: Liver disease No date: Lumbago No date: Multinodular goiter No date: Neck fracture (HCC) No date: Splenic artery aneurysm (HCC)  Past Surgical History: No date: APPENDECTOMY No date: BREAST CYST ASPIRATION No date: CARPAL TUNNEL RELEASE No date: CHOLECYSTECTOMY 08/07/2016: COLONOSCOPY; N/A     Comment:  Procedure:  COLONOSCOPY;  Surgeon: Lollie Sails, MD;              Location: [facility] ENDOSCOPY;  Service: Endoscopy;                Laterality: N/A; 03/06/2016: ESOPHAGOGASTRODUODENOSCOPY (EGD) WITH PROPOFOL; N/A     Comment:  Procedure: ESOPHAGOGASTRODUODENOSCOPY (EGD) WITH               PROPOFOL;  Surgeon: Hulen Luster, MD;  Location: [facility]               ENDOSCOPY;  Service: Gastroenterology;  Laterality: N/A; No date: HERNIA REPAIR 12/15/2017: LUMBAR LAMINECTOMY/DECOMPRESSION MICRODISCECTOMY; Right     Comment:  Procedure: LUMBAR LAMINECTOMY/DECOMPRESSION               MICRODISCECTOMY 1 LEVEL L4-5;  Surgeon: Meade Maw, MD;  Location: [facility] ORS;  Service: Neurosurgery;              Laterality: Right; No date: THYROID SURGERY No date: TONSILLECTOMY No date: TUBAL LIGATION  BMI    Body Mass Index:  19.13 kg/m      Reproductive/Obstetrics negative OB ROS                            Anesthesia Physical Anesthesia Plan  ASA: III  Anesthesia Plan: General   Post-op Pain Management:    Induction:   PONV Risk Score and Plan: Propofol infusion and TIVA  Airway Management Planned: Natural Airway and Nasal Cannula  Additional  Equipment:   Intra-op Plan:   Post-operative Plan:   Informed Consent: I have reviewed the patients History and Physical, chart, labs and discussed the procedure including the risks, benefits and alternatives for the proposed anesthesia with the patient or authorized representative who has indicated his/her understanding and acceptance.   Dental Advisory Given  Plan Discussed with: Anesthesiologist, CRNA and Surgeon  Anesthesia Plan Comments:         Anesthesia Quick Evaluation

## 2018-07-07 NOTE — H&P (Addendum)
Outpatient short stay form Pre-procedure 07/07/2018 11:26 AM Tina Sails MD  Primary Physician: Dr Harrel Lemon  Reason for visit:  Tina Bradley is a 67 year old female presenting for colonoscopy.  [name]  History of present illness:    Tina Bradley relates having some unintentional weight loss of about 10 to 15 pounds.  Did have a recent surgery for her back.  She has had several attempts at colonoscopy over the past number of years in different locations have been incomplete due to tortuosity in the left colon.  Had a CT colonoscopy however on 04/27/2018 indicating a possible 4 mm polyp in the a sending colon.  Also some sigmoid diverticulosis no evidence of a colonic mass or stricture.  Tina Bradley also relates having some loose stools.    Current Facility-Administered Medications:  .  0.9 %  sodium chloride infusion, , Intravenous, Continuous, Tina Sails, MD .  0.9 %  sodium chloride infusion, , Intravenous, Continuous, Tina Sails, MD, Last Rate: 20 mL/hr at 07/07/18 1055, 1,000 mL at 07/07/18 1055  Medications Prior to Admission  Medication Sig Dispense Refill Last Dose  . albuterol (PROVENTIL HFA;VENTOLIN HFA) 108 (90 Base) MCG/ACT inhaler Inhale 2 puffs into the lungs every 6 (six) hours as needed for wheezing or shortness of breath.   Past Week at Unknown time  . ALPRAZolam (XANAX) 0.5 MG tablet Take 0.5 mg by mouth 2 (two) times daily as needed for anxiety.    Past Week at Unknown time  . amitriptyline (ELAVIL) 50 MG tablet Take 50 mg by mouth at bedtime.    Past Week at Unknown time  . budesonide-formoterol (SYMBICORT) 160-4.5 MCG/ACT inhaler Inhale 2 puffs into the lungs 2 (two) times daily.   Past Week at Unknown time  . Cholecalciferol (VITAMIN D3 PO) Take 1 capsule by mouth daily.   Past Week at Unknown time  . dextromethorphan-guaiFENesin (MUCINEX DM) 30-600 MG 12hr tablet Take 3 tablets by mouth 2 (two) times daily.   Past Week at Unknown time  . fluconazole (DIFLUCAN) 150  MG tablet Take 150 mg by mouth daily.   Past Week at Unknown time  . gabapentin (NEURONTIN) 300 MG capsule Take 300 mg by mouth 2 (two) times daily.   Past Week at Unknown time  . methocarbamol (ROBAXIN) 500 MG tablet Take 1 tablet (500 mg total) by mouth every 6 (six) hours as needed for muscle spasms. 90 tablet 0 Past Week at Unknown time  . minocycline (MINOCIN,DYNACIN) 100 MG capsule Take 100 mg by mouth 2 (two) times daily.   Past Week at Unknown time  . naproxen (NAPROSYN) 500 MG tablet Take 1 tablet (500 mg total) by mouth 2 (two) times daily with a meal. 60 tablet 0 Past Week at Unknown time  . oxyCODONE (OXY IR/ROXICODONE) 5 MG immediate release tablet Take 1 tablet (5 mg total) by mouth every 3 (three) hours as needed for moderate pain ((score 4 to 6)). 30 tablet 0 Past Week at Unknown time  . pantoprazole (PROTONIX) 40 MG tablet Take 40 mg by mouth daily.   07/06/2018 at Unknown time  . sertraline (ZOLOFT) 50 MG tablet Take 50 mg by mouth at bedtime.   Past Week at Unknown time     Allergies  Allergen Reactions  . Augmentin [Amoxicillin-Pot Clavulanate] Other (See Comments)    She states upset stomach and diarrhea. Also blisters in mouth.  . Wellbutrin [Bupropion] Other (See Comments)    Bad dreams  . Propoxyphene Nausea  And Vomiting and Rash     Past Medical History:  Diagnosis Date  . Anemia   . Aneurysm of splenic artery (HCC)   . Aneurysm of splenic artery (HCC)   . Anxiety   . Back pain   . Cancer ([place])    skin cancer 2018  . COPD (chronic obstructive pulmonary disease) ([place])   . Depression   . Gastritis   . GERD (gastroesophageal reflux disease)   . Gout   . Hepatitis    c w/o coma  . Hip fracture ([place])   . History of hiatal hernia   . History of kidney stones   . Hypertension   . IBS (irritable bowel syndrome)   . Liver disease   . Lumbago   . Multinodular goiter   . Neck fracture ([place])   . Splenic artery aneurysm [facility])     Review of systems:       Physical Exam    Heart and lungs: Rhythm without rub or gallop, lungs are bilaterally clear.    HEENT: Normocephalic atraumatic eyes are anicteric    Other:    Pertinant exam for procedure: Soft nontender nondistended bowel sounds positive normoactive.    Planned proceedures:colonoscopy and indicated procedures. I have discussed the risks benefits and complications of procedures to include not limited to bleeding, infection, perforation and the risk of sedation and the Tina Bradley wishes to proceed.    Tina Sails, MD Gastroenterology 07/07/2018  11:26 AM

## 2018-07-08 ENCOUNTER — Encounter: Payer: Self-pay | Admitting: Gastroenterology

## 2018-07-09 NOTE — Anesthesia Postprocedure Evaluation (Signed)
Anesthesia Post Note  Patient: Tina Bradley  Procedure(s) Performed: COLONOSCOPY WITH PROPOFOL (N/A )  Patient location during evaluation: PACU Anesthesia Type: General Level of consciousness: awake and alert Pain management: pain level controlled Vital Signs Assessment: post-procedure vital signs reviewed and stable Respiratory status: spontaneous breathing, nonlabored ventilation, respiratory function stable and patient connected to nasal cannula oxygen Cardiovascular status: blood pressure returned to baseline and stable Postop Assessment: no apparent nausea or vomiting Anesthetic complications: no     Last Vitals:  Vitals:   07/07/18 1234 07/07/18 1244  BP: 140/90 (!) 157/87  Pulse: 73 82  Resp: 19 16  Temp:    SpO2: 100% 100%    Last Pain:  Vitals:   07/07/18 1244  TempSrc:   PainSc: 0-No pain                 Durenda Hurt

## 2018-08-03 DIAGNOSIS — L578 Other skin changes due to chronic exposure to nonionizing radiation: Secondary | ICD-10-CM | POA: Diagnosis not present

## 2018-08-03 DIAGNOSIS — L821 Other seborrheic keratosis: Secondary | ICD-10-CM | POA: Diagnosis not present

## 2018-08-03 DIAGNOSIS — L570 Actinic keratosis: Secondary | ICD-10-CM | POA: Diagnosis not present

## 2018-08-03 DIAGNOSIS — Z1283 Encounter for screening for malignant neoplasm of skin: Secondary | ICD-10-CM | POA: Diagnosis not present

## 2018-08-24 DIAGNOSIS — K589 Irritable bowel syndrome without diarrhea: Secondary | ICD-10-CM | POA: Diagnosis not present

## 2018-08-24 DIAGNOSIS — F329 Major depressive disorder, single episode, unspecified: Secondary | ICD-10-CM | POA: Diagnosis not present

## 2018-08-24 DIAGNOSIS — Z0000 Encounter for general adult medical examination without abnormal findings: Secondary | ICD-10-CM | POA: Diagnosis not present

## 2018-08-24 DIAGNOSIS — Z8619 Personal history of other infectious and parasitic diseases: Secondary | ICD-10-CM | POA: Diagnosis not present

## 2018-08-24 DIAGNOSIS — E079 Disorder of thyroid, unspecified: Secondary | ICD-10-CM | POA: Diagnosis not present

## 2018-08-24 DIAGNOSIS — K219 Gastro-esophageal reflux disease without esophagitis: Secondary | ICD-10-CM | POA: Diagnosis not present

## 2018-08-24 DIAGNOSIS — M109 Gout, unspecified: Secondary | ICD-10-CM | POA: Diagnosis not present

## 2018-08-24 DIAGNOSIS — M5136 Other intervertebral disc degeneration, lumbar region: Secondary | ICD-10-CM | POA: Diagnosis not present

## 2019-02-22 DIAGNOSIS — R935 Abnormal findings on diagnostic imaging of other abdominal regions, including retroperitoneum: Secondary | ICD-10-CM | POA: Diagnosis not present

## 2019-02-22 DIAGNOSIS — K219 Gastro-esophageal reflux disease without esophagitis: Secondary | ICD-10-CM | POA: Diagnosis not present

## 2019-02-22 DIAGNOSIS — R933 Abnormal findings on diagnostic imaging of other parts of digestive tract: Secondary | ICD-10-CM | POA: Diagnosis not present

## 2019-02-22 DIAGNOSIS — K589 Irritable bowel syndrome without diarrhea: Secondary | ICD-10-CM | POA: Diagnosis not present

## 2019-03-16 DIAGNOSIS — G8929 Other chronic pain: Secondary | ICD-10-CM | POA: Diagnosis not present

## 2019-03-16 DIAGNOSIS — F329 Major depressive disorder, single episode, unspecified: Secondary | ICD-10-CM | POA: Diagnosis not present

## 2019-03-16 DIAGNOSIS — K589 Irritable bowel syndrome without diarrhea: Secondary | ICD-10-CM | POA: Diagnosis not present

## 2019-03-16 DIAGNOSIS — Z0000 Encounter for general adult medical examination without abnormal findings: Secondary | ICD-10-CM | POA: Diagnosis not present

## 2019-03-16 DIAGNOSIS — I10 Essential (primary) hypertension: Secondary | ICD-10-CM | POA: Diagnosis not present

## 2019-03-16 DIAGNOSIS — Z720 Tobacco use: Secondary | ICD-10-CM | POA: Diagnosis not present

## 2019-03-16 DIAGNOSIS — M545 Low back pain: Secondary | ICD-10-CM | POA: Diagnosis not present

## 2019-03-16 DIAGNOSIS — K219 Gastro-esophageal reflux disease without esophagitis: Secondary | ICD-10-CM | POA: Diagnosis not present

## 2019-03-16 DIAGNOSIS — E079 Disorder of thyroid, unspecified: Secondary | ICD-10-CM | POA: Diagnosis not present

## 2019-04-20 DIAGNOSIS — K219 Gastro-esophageal reflux disease without esophagitis: Secondary | ICD-10-CM | POA: Diagnosis not present

## 2019-04-20 DIAGNOSIS — I10 Essential (primary) hypertension: Secondary | ICD-10-CM | POA: Diagnosis not present

## 2019-04-20 DIAGNOSIS — M5136 Other intervertebral disc degeneration, lumbar region: Secondary | ICD-10-CM | POA: Diagnosis not present

## 2019-04-20 DIAGNOSIS — E079 Disorder of thyroid, unspecified: Secondary | ICD-10-CM | POA: Diagnosis not present

## 2019-04-20 DIAGNOSIS — F329 Major depressive disorder, single episode, unspecified: Secondary | ICD-10-CM | POA: Diagnosis not present

## 2019-06-14 DIAGNOSIS — E079 Disorder of thyroid, unspecified: Secondary | ICD-10-CM | POA: Diagnosis not present

## 2019-06-14 DIAGNOSIS — I10 Essential (primary) hypertension: Secondary | ICD-10-CM | POA: Diagnosis not present

## 2019-06-21 DIAGNOSIS — Z1231 Encounter for screening mammogram for malignant neoplasm of breast: Secondary | ICD-10-CM | POA: Diagnosis not present

## 2019-06-21 DIAGNOSIS — I10 Essential (primary) hypertension: Secondary | ICD-10-CM | POA: Diagnosis not present

## 2019-06-21 DIAGNOSIS — Z0001 Encounter for general adult medical examination with abnormal findings: Secondary | ICD-10-CM | POA: Diagnosis not present

## 2019-06-21 DIAGNOSIS — E079 Disorder of thyroid, unspecified: Secondary | ICD-10-CM | POA: Diagnosis not present

## 2019-06-21 DIAGNOSIS — E559 Vitamin D deficiency, unspecified: Secondary | ICD-10-CM | POA: Diagnosis not present

## 2019-06-21 DIAGNOSIS — Z720 Tobacco use: Secondary | ICD-10-CM | POA: Diagnosis not present

## 2019-06-21 DIAGNOSIS — E538 Deficiency of other specified B group vitamins: Secondary | ICD-10-CM | POA: Diagnosis not present

## 2019-06-21 DIAGNOSIS — K589 Irritable bowel syndrome without diarrhea: Secondary | ICD-10-CM | POA: Diagnosis not present

## 2019-06-21 DIAGNOSIS — F329 Major depressive disorder, single episode, unspecified: Secondary | ICD-10-CM | POA: Diagnosis not present

## 2019-07-17 DIAGNOSIS — Z01812 Encounter for preprocedural laboratory examination: Secondary | ICD-10-CM | POA: Diagnosis not present

## 2019-07-17 DIAGNOSIS — Z1159 Encounter for screening for other viral diseases: Secondary | ICD-10-CM | POA: Diagnosis not present

## 2019-07-17 DIAGNOSIS — Z20828 Contact with and (suspected) exposure to other viral communicable diseases: Secondary | ICD-10-CM | POA: Diagnosis not present

## 2019-07-19 DIAGNOSIS — Z79899 Other long term (current) drug therapy: Secondary | ICD-10-CM | POA: Diagnosis not present

## 2019-07-19 DIAGNOSIS — E890 Postprocedural hypothyroidism: Secondary | ICD-10-CM | POA: Diagnosis not present

## 2019-07-19 DIAGNOSIS — Z800 Family history of malignant neoplasm of digestive organs: Secondary | ICD-10-CM | POA: Diagnosis not present

## 2019-07-19 DIAGNOSIS — Z1211 Encounter for screening for malignant neoplasm of colon: Secondary | ICD-10-CM | POA: Diagnosis not present

## 2019-07-19 DIAGNOSIS — F17200 Nicotine dependence, unspecified, uncomplicated: Secondary | ICD-10-CM | POA: Diagnosis not present

## 2019-07-19 DIAGNOSIS — I10 Essential (primary) hypertension: Secondary | ICD-10-CM | POA: Diagnosis not present

## 2019-07-19 DIAGNOSIS — Z880 Allergy status to penicillin: Secondary | ICD-10-CM | POA: Diagnosis not present

## 2019-08-08 DIAGNOSIS — Z872 Personal history of diseases of the skin and subcutaneous tissue: Secondary | ICD-10-CM | POA: Diagnosis not present

## 2019-08-08 DIAGNOSIS — C44319 Basal cell carcinoma of skin of other parts of face: Secondary | ICD-10-CM | POA: Diagnosis not present

## 2019-08-08 DIAGNOSIS — L578 Other skin changes due to chronic exposure to nonionizing radiation: Secondary | ICD-10-CM | POA: Diagnosis not present

## 2019-08-08 DIAGNOSIS — Z85828 Personal history of other malignant neoplasm of skin: Secondary | ICD-10-CM | POA: Diagnosis not present

## 2019-08-08 DIAGNOSIS — D485 Neoplasm of uncertain behavior of skin: Secondary | ICD-10-CM | POA: Diagnosis not present

## 2019-08-28 DIAGNOSIS — C4491 Basal cell carcinoma of skin, unspecified: Secondary | ICD-10-CM | POA: Diagnosis not present

## 2019-08-28 DIAGNOSIS — C44319 Basal cell carcinoma of skin of other parts of face: Secondary | ICD-10-CM | POA: Diagnosis not present

## 2019-09-11 DIAGNOSIS — C44319 Basal cell carcinoma of skin of other parts of face: Secondary | ICD-10-CM | POA: Diagnosis not present

## 2019-10-02 DIAGNOSIS — R0781 Pleurodynia: Secondary | ICD-10-CM | POA: Diagnosis not present

## 2019-12-12 DIAGNOSIS — E559 Vitamin D deficiency, unspecified: Secondary | ICD-10-CM | POA: Diagnosis not present

## 2019-12-12 DIAGNOSIS — E538 Deficiency of other specified B group vitamins: Secondary | ICD-10-CM | POA: Diagnosis not present

## 2019-12-12 DIAGNOSIS — E079 Disorder of thyroid, unspecified: Secondary | ICD-10-CM | POA: Diagnosis not present

## 2019-12-12 DIAGNOSIS — I10 Essential (primary) hypertension: Secondary | ICD-10-CM | POA: Diagnosis not present

## 2019-12-19 DIAGNOSIS — Z0000 Encounter for general adult medical examination without abnormal findings: Secondary | ICD-10-CM | POA: Diagnosis not present

## 2019-12-19 DIAGNOSIS — I10 Essential (primary) hypertension: Secondary | ICD-10-CM | POA: Diagnosis not present

## 2019-12-19 DIAGNOSIS — F329 Major depressive disorder, single episode, unspecified: Secondary | ICD-10-CM | POA: Diagnosis not present

## 2019-12-19 DIAGNOSIS — M545 Low back pain: Secondary | ICD-10-CM | POA: Diagnosis not present

## 2019-12-19 DIAGNOSIS — F17210 Nicotine dependence, cigarettes, uncomplicated: Secondary | ICD-10-CM | POA: Diagnosis not present

## 2019-12-19 DIAGNOSIS — E079 Disorder of thyroid, unspecified: Secondary | ICD-10-CM | POA: Diagnosis not present

## 2019-12-19 DIAGNOSIS — G8929 Other chronic pain: Secondary | ICD-10-CM | POA: Diagnosis not present

## 2019-12-19 DIAGNOSIS — Z720 Tobacco use: Secondary | ICD-10-CM | POA: Diagnosis not present

## 2019-12-19 DIAGNOSIS — E538 Deficiency of other specified B group vitamins: Secondary | ICD-10-CM | POA: Diagnosis not present

## 2019-12-19 DIAGNOSIS — E559 Vitamin D deficiency, unspecified: Secondary | ICD-10-CM | POA: Diagnosis not present

## 2019-12-19 DIAGNOSIS — Z1231 Encounter for screening mammogram for malignant neoplasm of breast: Secondary | ICD-10-CM | POA: Diagnosis not present

## 2020-02-29 DIAGNOSIS — R0982 Postnasal drip: Secondary | ICD-10-CM | POA: Diagnosis not present

## 2020-02-29 DIAGNOSIS — H9201 Otalgia, right ear: Secondary | ICD-10-CM | POA: Diagnosis not present

## 2020-02-29 DIAGNOSIS — K219 Gastro-esophageal reflux disease without esophagitis: Secondary | ICD-10-CM | POA: Diagnosis not present

## 2020-02-29 DIAGNOSIS — K589 Irritable bowel syndrome without diarrhea: Secondary | ICD-10-CM | POA: Diagnosis not present

## 2020-02-29 DIAGNOSIS — K2970 Gastritis, unspecified, without bleeding: Secondary | ICD-10-CM | POA: Diagnosis not present

## 2020-06-18 DIAGNOSIS — E079 Disorder of thyroid, unspecified: Secondary | ICD-10-CM | POA: Diagnosis not present

## 2020-06-18 DIAGNOSIS — I10 Essential (primary) hypertension: Secondary | ICD-10-CM | POA: Diagnosis not present

## 2020-06-18 DIAGNOSIS — E538 Deficiency of other specified B group vitamins: Secondary | ICD-10-CM | POA: Diagnosis not present

## 2020-06-18 DIAGNOSIS — E559 Vitamin D deficiency, unspecified: Secondary | ICD-10-CM | POA: Diagnosis not present

## 2020-06-25 ENCOUNTER — Other Ambulatory Visit: Payer: Self-pay | Admitting: Internal Medicine

## 2020-06-25 DIAGNOSIS — G8929 Other chronic pain: Secondary | ICD-10-CM | POA: Diagnosis not present

## 2020-06-25 DIAGNOSIS — I10 Essential (primary) hypertension: Secondary | ICD-10-CM | POA: Diagnosis not present

## 2020-06-25 DIAGNOSIS — M545 Low back pain: Secondary | ICD-10-CM | POA: Diagnosis not present

## 2020-06-25 DIAGNOSIS — K219 Gastro-esophageal reflux disease without esophagitis: Secondary | ICD-10-CM | POA: Diagnosis not present

## 2020-06-25 DIAGNOSIS — Z0001 Encounter for general adult medical examination with abnormal findings: Secondary | ICD-10-CM | POA: Diagnosis not present

## 2020-06-25 DIAGNOSIS — R911 Solitary pulmonary nodule: Secondary | ICD-10-CM | POA: Diagnosis not present

## 2020-06-25 DIAGNOSIS — Z720 Tobacco use: Secondary | ICD-10-CM | POA: Diagnosis not present

## 2020-06-25 DIAGNOSIS — E079 Disorder of thyroid, unspecified: Secondary | ICD-10-CM | POA: Diagnosis not present

## 2020-06-25 DIAGNOSIS — Z1231 Encounter for screening mammogram for malignant neoplasm of breast: Secondary | ICD-10-CM

## 2020-06-25 DIAGNOSIS — F329 Major depressive disorder, single episode, unspecified: Secondary | ICD-10-CM | POA: Diagnosis not present

## 2020-07-16 ENCOUNTER — Other Ambulatory Visit: Payer: Self-pay | Admitting: Internal Medicine

## 2020-07-16 DIAGNOSIS — R921 Mammographic calcification found on diagnostic imaging of breast: Secondary | ICD-10-CM

## 2020-08-01 ENCOUNTER — Ambulatory Visit
Admission: RE | Admit: 2020-08-01 | Discharge: 2020-08-01 | Disposition: A | Discharge status: Home / Self Care | Payer: PPO | Source: Ambulatory Visit | Attending: Internal Medicine | Admitting: Internal Medicine

## 2020-08-01 ENCOUNTER — Other Ambulatory Visit: Payer: Self-pay

## 2020-08-01 ENCOUNTER — Other Ambulatory Visit: Payer: Self-pay | Admitting: Internal Medicine

## 2020-08-01 DIAGNOSIS — R921 Mammographic calcification found on diagnostic imaging of breast: Secondary | ICD-10-CM | POA: Insufficient documentation

## 2020-08-01 DIAGNOSIS — R928 Other abnormal and inconclusive findings on diagnostic imaging of breast: Secondary | ICD-10-CM

## 2020-08-05 ENCOUNTER — Other Ambulatory Visit: Payer: Self-pay | Admitting: Internal Medicine

## 2020-08-05 DIAGNOSIS — R928 Other abnormal and inconclusive findings on diagnostic imaging of breast: Secondary | ICD-10-CM

## 2020-08-07 ENCOUNTER — Ambulatory Visit
Admission: RE | Admit: 2020-08-07 | Discharge: 2020-08-07 | Disposition: A | Discharge status: Home / Self Care | Payer: PPO | Source: Ambulatory Visit | Attending: Internal Medicine | Admitting: Internal Medicine

## 2020-08-07 ENCOUNTER — Other Ambulatory Visit: Payer: Self-pay

## 2020-08-07 DIAGNOSIS — R928 Other abnormal and inconclusive findings on diagnostic imaging of breast: Secondary | ICD-10-CM | POA: Diagnosis not present

## 2020-08-07 DIAGNOSIS — N6031 Fibrosclerosis of right breast: Secondary | ICD-10-CM | POA: Diagnosis not present

## 2020-08-07 HISTORY — PX: BREAST BIOPSY: SHX20

## 2020-08-08 LAB — SURGICAL PATHOLOGY

## 2020-08-12 ENCOUNTER — Telehealth: Payer: Self-pay

## 2020-08-12 NOTE — Telephone Encounter (Signed)
Ms. Nantz has an appt with Dr. Peyton Najjar on Thursday 08/15/2020 at 10:30 am.  Patient is aware of time/date.

## 2020-08-15 ENCOUNTER — Ambulatory Visit: Payer: Self-pay | Admitting: General Surgery

## 2020-08-15 ENCOUNTER — Other Ambulatory Visit: Payer: Self-pay | Admitting: General Surgery

## 2020-08-15 DIAGNOSIS — D241 Benign neoplasm of right breast: Secondary | ICD-10-CM

## 2020-08-15 NOTE — H&P (View-Only) (Signed)
PATIENT PROFILE: Tina Bradley is a 69 y.o. female who presents to the Clinic for consultation at the request of Dr. Ola Bradley for evaluation of right breast intraductal papilloma.  PCP:  Tina Ochs, MD  HISTORY OF PRESENT ILLNESS: Tina Bradley reports she was having diagnostic mammograms to follow up calcifications of the left breast. This has been followed for two years without any changes on the morphology. Last diagnostic mammogram on 08/01/20 she was found with a distortion on the right breast. This led to stereotactic core needle biopsy. 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 I personally evaluated the images of the diagnostic mammogram, core needle biopsy and clip placement. Patient denies any nipple discharge or retraction.   Family history of breast cancer: sister of grandmother's from mother side Family history of other cancers: sister with lung cancer, sister with colon cancer Menarche: 30 Menopause: early 35's Used OCP: none Used estrogen and progesterone therapy: none  History of Radiation to the chest: none Number of pregnancies: 4 (two complete) Age of first pregnancys: 49 No previous breast biopsy  PROBLEM LIST: Problem List  Date Reviewed: 06/25/2020         Noted   Essential hypertension 03/16/2019   Lumbar radiculopathy 12/15/2017   Pulmonary nodule 03/10/2017   Vitamin D deficiency 12/08/2016   Tobacco abuse 12/08/2016   GERD (gastroesophageal reflux disease) 09/09/2016   Chronic depression 03/23/2016   Abnormal weight loss 02/10/2016   Esophageal dysphagia 02/10/2016   Multinodular goiter 01/08/2016   Overview    s/p rt hemothyroidectomy 12/2013      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   DDD (degenerative disc disease), lumbar 07/22/2015   Lumbar radiculitis 07/22/2015   Cervical radiculitis 07/22/2015   DDD (degenerative disc disease), cervical 07/22/2015   Chronic low back pain 07/09/2015   Pedal edema 07/08/2015   IBS  (irritable bowel syndrome) Unknown   Gastritis Unknown   Hiatal hernia Unknown   Gout Unknown   Hx of hepatitis C Unknown   Thyroid disease Unknown   Left arm pain 05/16/2014   Edema 02/22/2014      GENERAL REVIEW OF SYSTEMS:   General ROS: negative for - chills, fatigue, fever, weight gain or weight loss Allergy and Immunology ROS: negative for - hives  Hematological and Lymphatic ROS: negative for - bleeding problems or bruising, negative for palpable nodes Endocrine ROS: negative for - heat or cold intolerance, hair changes Respiratory ROS: negative for - cough, shortness of breath or wheezing Cardiovascular ROS: no chest pain or palpitations GI ROS: negative for nausea, vomiting, abdominal pain, diarrhea, constipation Musculoskeletal ROS: negative for - joint swelling or muscle pain Neurological ROS: negative for - confusion, syncope Dermatological ROS: negative for pruritus and rash Psychiatric: negative for anxiety, depression, difficulty sleeping and memory loss  MEDICATIONS: Current Outpatient Medications  Medication Sig Dispense Refill  . acetaminophen (TYLENOL EXTRA STRENGTH) 500 MG tablet Take 500 mg by mouth every 8 (eight) hours as needed for Pain    . ALPRAZolam (XANAX) 0.5 MG tablet TAKE 1/2 TO 1 TABLET BY MOUTH EVERY 12 HOURS AS NEEDED 30 tablet 1  . amitriptyline (ELAVIL) 50 MG tablet TAKE 1 TABLET BY MOUTH EVERY NIGHT 90 tablet 5  . CHOLECALCIFEROL, VITAMIN D3, ORAL Take 1 tablet by mouth once daily.    . cyanocobalamin (VITAMIN B12) 1000 MCG tablet Take 1,000 mcg by mouth once daily    . ibuprofen (MOTRIN) 200 MG tablet Take 200 mg by mouth  every 6 (six) hours as needed for Pain    . metoprolol succinate (TOPROL-XL) 25 MG XL tablet TAKE 1 TABLET(25 MG) BY MOUTH EVERY DAY 90 tablet 1  . ondansetron (ZOFRAN-ODT) 4 MG disintegrating tablet Take 1 tablet (4 mg total) by mouth every 8 (eight) hours as needed for Nausea or Vomiting 30 tablet 1  . pantoprazole (PROTONIX)  40 MG DR tablet Take 1 tablet by mouth 30 minutes before breakfast. Take an additional tablet at night as needed for reflux. 180 tablet 3   No current facility-administered medications for this visit.    ALLERGIES: Augmentin [amoxicillin-pot clavulanate], Darvon-n [propoxyphene napsylate], Wellbutrin [bupropion hcl], and Propoxyphene  PAST MEDICAL HISTORY: Past Medical History:  Diagnosis Date  . Anemia   . Aneurysm of splenic artery (CMS-HCC)   . Gastritis   . GERD (gastroesophageal reflux disease)   . Gout   . Hep C w/o coma, chronic (CMS-HCC)   . Hiatal hernia   . Hip fracture (CMS-HCC) 2009  . Hx of hepatitis C   . Hypertension   . IBS (irritable bowel syndrome)   . Liver disease   . Lumbago   . Multinodular goiter    s/p rt hemothyroidectomy 12/2013  . Neck fracture (CMS-HCC) 2009  . Other closed fractures of distal end of radius (alone)   . Splenic artery aneurysm (CMS-HCC)     PAST SURGICAL HISTORY: Past Surgical History:  Procedure Laterality Date  . APPENDECTOMY    . CHOLECYSTECTOMY    . COLONOSCOPY  01/13/2008  . COLONOSCOPY  08/07/2016   Diverticulosis/Focal crypt irregularity/Repeat CT Colon or ACBE in 65yrs/MUS  . COLONOSCOPY  07/07/2018   Diverticulosis/[facility] at [facility]  . EGD  11/09/2012   01/13/2008, 03/26/2000  . EGD  03/06/2016   Gastritis/No Repeat/MUS  . hemithyroidectomy Right 12/2013  . HERNIORRHAPHY Right   . Right carpal tunnel release    . Right L4-5 microdiscectomy  12/15/2017   Dr Tina Bradley  . TUBAL LIGATION       FAMILY HISTORY: Family History  Problem Relation Age of Onset  . Diabetes type II Mother   . Angina Mother   . Cirrhosis Father   . High blood pressure (Hypertension) Maternal Grandfather   . Colon cancer Sister      SOCIAL HISTORY: Social History   Socioeconomic History  . Marital status: Married    Spouse name: Not on file  . Number of children: Not on file  . Years of education: Not on  file  . Highest education level: Not on file  Occupational History  . Not on file  Tobacco Use  . Smoking status: Current Some Day Smoker    Types: Cigarettes  . Smokeless tobacco: Former Systems developer    Quit date: 01/09/2014  . Tobacco comment: uses vape w/ $Remov'0mg'rfviNd$  of nicotine- Back to smoking 1-3 cigarettes a day  Vaping Use  . Vaping Use: Every day  Substance and Sexual Activity  . Alcohol use: No    Alcohol/week: 0.0 standard drinks    Comment: HEAVY IN THE PAST, OCCASIONALLY NOW  . Drug use: No    Comment: marijuana occasionally, last used Christmas 2012  . Sexual activity: Never  Other Topics Concern  . Not on file  Social History Narrative  . Not on file   Social Determinants of Health   Financial Resource Strain: Not on file  Food Insecurity: Not on file  Transportation Needs: Not on file    PHYSICAL EXAM:  Vitals:   08/15/20 1029  BP: (!) 173/94  Pulse: 72   Body mass index is 19.31 kg/m. Weight: 49.4 kg (109 lb)   GENERAL: Alert, active, oriented x3  HEENT: Pupils equal reactive to light. Extraocular movements are intact. Sclera clear. Palpebral conjunctiva normal red color.Pharynx clear.  NECK: Supple with no palpable mass and no adenopathy.  LUNGS: Sound clear with no rales rhonchi or wheezes.  HEART: Regular rhythm S1 and S2 without murmur.  BREAST: left breast normal without mass, skin or nipple changes or axillary nodes. Right breast with palpable hematoma on the inferior medial aspect. Small bruise on inferior breast. No axillary adenopathy.   ABDOMEN: Soft and depressible, nontender with no palpable mass, no hepatomegaly.  EXTREMITIES: Well-developed well-nourished symmetrical with no dependent edema.  NEUROLOGICAL: Awake alert oriented, facial expression symmetrical, moving all extremities.  REVIEW OF DATA: I have reviewed the following data today: Appointment on 06/18/2020  Component Date Value  . Glucose 06/18/2020 101   . Sodium 06/18/2020 142    . Potassium 06/18/2020 4.1   . Chloride 06/18/2020 107   . Carbon Dioxide (CO2) 06/18/2020 31.6   . Urea Nitrogen (BUN) 06/18/2020 11   . Creatinine 06/18/2020 0.8   . Glomerular Filtration Ra* 06/18/2020 71   . Calcium 06/18/2020 9.3   . AST  06/18/2020 17   . ALT  06/18/2020 9   . Alk Phos (alkaline Phosp* 06/18/2020 67   . Albumin 06/18/2020 4.1   . Bilirubin, Total 06/18/2020 0.3   . Protein, Total 06/18/2020 6.3   . A/G Ratio 06/18/2020 1.9   . WBC (White Blood Cell Co* 06/18/2020 6.9   . RBC (Red Blood Cell Coun* 06/18/2020 4.87   . Hemoglobin 06/18/2020 15.3*  . Hematocrit 06/18/2020 45.7   . MCV (Mean Corpuscular Vo* 06/18/2020 93.8   . MCH (Mean Corpuscular He* 06/18/2020 31.4*  . MCHC (Mean Corpuscular H* 06/18/2020 33.5   . Platelet Count 06/18/2020 370   . RDW-CV (Red Cell Distrib* 06/18/2020 14.0   . MPV (Mean Platelet Volum* 06/18/2020 11.4   . Neutrophils 06/18/2020 4.38   . Lymphocytes 06/18/2020 1.64   . Monocytes 06/18/2020 0.48   . Eosinophils 06/18/2020 0.25   . Basophils 06/18/2020 0.11*  . Neutrophil % 06/18/2020 63.8   . Lymphocyte % 06/18/2020 23.9   . Monocyte % 06/18/2020 7.0   . Eosinophil % 06/18/2020 3.6   . Basophil% 06/18/2020 1.6   . Immature Granulocyte % 06/18/2020 0.1   . Immature Granulocyte Cou* 06/18/2020 0.01   . Cholesterol, Total 06/18/2020 191   . Triglyceride 06/18/2020 138   . HDL (High Density Lipopr* 71/25/2712 38.8   . LDL Calculated 06/18/2020 929   . VLDL Cholesterol 06/18/2020 28   . Cholesterol/HDL Ratio 06/18/2020 4.9   . Color 06/18/2020 Yellow   . Clarity 06/18/2020 Clear   . Specific Gravity 06/18/2020 1.020   . pH, Urine 06/18/2020 6.0   . Protein, Urinalysis 06/18/2020 Negative   . Glucose, Urinalysis 06/18/2020 Negative   . Ketones, Urinalysis 06/18/2020 Negative   . Blood, Urinalysis 06/18/2020 Negative   . Nitrite, Urinalysis 06/18/2020 Negative   . Leukocyte Esterase, Urin* 06/18/2020 Negative   . White  Blood Cells, Urina* 06/18/2020 0-3   . Red Blood Cells, Urinaly* 06/18/2020 None Seen   . Bacteria, Urinalysis 06/18/2020 Rare*  . Squamous Epithelial Cell* 06/18/2020 Rare   . Thyroid Stimulating Horm* 06/18/2020 4.048   . Vitamin B12 06/18/2020 >1,500   . Vitamin D,  25-Hydroxy - * 06/18/2020 45.7      ASSESSMENT: Ms. Boody is a 69 y.o. female presenting for consultation for intraductal papilloma.    Patient was oriented again about the pathology results. Surgical alternatives were discussed with patient including excisional biopsy for diagnostic purposes to rule out malignancy. Surgical technique and post operative care was discussed with patient. Risk of surgery was discussed with patient including but not limited to: wound infection, seroma, hematoma, brachial plexopathy, mondor's disease (thrombosis of small veins of breast), chronic wound pain, breast lymphedema, altered sensation to the nipple and cosmesis among others.   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 Radiofrequency guided excisional biopsy of the right breast (19125) 2. CBC, CMP done 3. Avoid taking aspirin 5 days before surgery 4. Contact us if you have any concernn  Patient verbalized understanding, all questions were answered, and were agreeable with the plan outlined above.     Herbert Pun, MD  Electronically signed by Herbert Pun, MD

## 2020-08-15 NOTE — H&P (Signed)
PATIENT PROFILE: Tina Bradley is a 69 y.o. female who presents to the Clinic for consultation at the request of Dr. Sampson Goon for evaluation of right breast intraductal papilloma.  PCP:  Enzo Montgomery, MD  HISTORY OF PRESENT ILLNESS: [name] reports she was having diagnostic mammograms to follow up calcifications of the left breast. This has been followed for two years without any changes on the morphology. Last diagnostic mammogram on 08/01/20 she was found with a distortion on the right breast. This led to stereotactic core needle biopsy. 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 I personally evaluated the images of the diagnostic mammogram, core needle biopsy and clip placement. Patient denies any nipple discharge or retraction.   Family history of breast cancer: sister of grandmother's from mother side Family history of other cancers: sister with lung cancer, sister with colon cancer Menarche: 16 Menopause: early 75's Used OCP: none Used estrogen and progesterone therapy: none  History of Radiation to the chest: none Number of pregnancies: 4 (two complete) Age of first pregnancys: 39 No previous breast biopsy  PROBLEM LIST: Problem List  Date Reviewed: 06/25/2020         Noted   Essential hypertension 03/16/2019   Lumbar radiculopathy 12/15/2017   Pulmonary nodule 03/10/2017   Vitamin D deficiency 12/08/2016   Tobacco abuse 12/08/2016   GERD (gastroesophageal reflux disease) 09/09/2016   Chronic depression 03/23/2016   Abnormal weight loss 02/10/2016   Esophageal dysphagia 02/10/2016   Multinodular goiter 01/08/2016   Overview    s/p rt hemothyroidectomy 12/2013      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   DDD (degenerative disc disease), lumbar 07/22/2015   Lumbar radiculitis 07/22/2015   Cervical radiculitis 07/22/2015   DDD (degenerative disc disease), cervical 07/22/2015   Chronic low back pain 07/09/2015   Pedal edema 07/08/2015   IBS  (irritable bowel syndrome) Unknown   Gastritis Unknown   Hiatal hernia Unknown   Gout Unknown   Hx of hepatitis C Unknown   Thyroid disease Unknown   Left arm pain 05/16/2014   Edema 02/22/2014      GENERAL REVIEW OF SYSTEMS:   General ROS: negative for - chills, fatigue, fever, weight gain or weight loss Allergy and Immunology ROS: negative for - hives  Hematological and Lymphatic ROS: negative for - bleeding problems or bruising, negative for palpable nodes Endocrine ROS: negative for - heat or cold intolerance, hair changes Respiratory ROS: negative for - cough, shortness of breath or wheezing Cardiovascular ROS: no chest pain or palpitations GI ROS: negative for nausea, vomiting, abdominal pain, diarrhea, constipation Musculoskeletal ROS: negative for - joint swelling or muscle pain Neurological ROS: negative for - confusion, syncope Dermatological ROS: negative for pruritus and rash Psychiatric: negative for anxiety, depression, difficulty sleeping and memory loss  MEDICATIONS: Current Outpatient Medications  Medication Sig Dispense Refill  . acetaminophen (TYLENOL EXTRA STRENGTH) 500 MG tablet Take 500 mg by mouth every 8 (eight) hours as needed for Pain    . ALPRAZolam (XANAX) 0.5 MG tablet TAKE 1/2 TO 1 TABLET BY MOUTH EVERY 12 HOURS AS NEEDED 30 tablet 1  . amitriptyline (ELAVIL) 50 MG tablet TAKE 1 TABLET BY MOUTH EVERY NIGHT 90 tablet 5  . CHOLECALCIFEROL, VITAMIN D3, ORAL Take 1 tablet by mouth once daily.    . cyanocobalamin (VITAMIN B12) 1000 MCG tablet Take 1,000 mcg by mouth once daily    . ibuprofen (MOTRIN) 200 MG tablet Take 200 mg by mouth  every 6 (six) hours as needed for Pain    . metoprolol succinate (TOPROL-XL) 25 MG XL tablet TAKE 1 TABLET(25 MG) BY MOUTH EVERY DAY 90 tablet 1  . ondansetron (ZOFRAN-ODT) 4 MG disintegrating tablet Take 1 tablet (4 mg total) by mouth every 8 (eight) hours as needed for Nausea or Vomiting 30 tablet 1  . pantoprazole (PROTONIX)  40 MG DR tablet Take 1 tablet by mouth 30 minutes before breakfast. Take an additional tablet at night as needed for reflux. 180 tablet 3   No current facility-administered medications for this visit.    ALLERGIES: Augmentin [amoxicillin-pot clavulanate], Darvon-n [propoxyphene napsylate], Wellbutrin [bupropion hcl], and Propoxyphene  PAST MEDICAL HISTORY: Past Medical History:  Diagnosis Date  . Anemia   . Aneurysm of splenic artery (CMS-HCC)   . Gastritis   . GERD (gastroesophageal reflux disease)   . Gout   . Hep C w/o coma, chronic (CMS-HCC)   . Hiatal hernia   . Hip fracture (CMS-HCC) 2009  . Hx of hepatitis C   . Hypertension   . IBS (irritable bowel syndrome)   . Liver disease   . Lumbago   . Multinodular goiter    s/p rt hemothyroidectomy 12/2013  . Neck fracture (CMS-HCC) 2009  . Other closed fractures of distal end of radius (alone)   . Splenic artery aneurysm (CMS-HCC)     PAST SURGICAL HISTORY: Past Surgical History:  Procedure Laterality Date  . APPENDECTOMY    . CHOLECYSTECTOMY    . COLONOSCOPY  01/13/2008  . COLONOSCOPY  08/07/2016   Diverticulosis/Focal crypt irregularity/Repeat CT Colon or ACBE in 50yrs/MUS  . COLONOSCOPY  07/07/2018   Diverticulosis/[facility] Stephanie [facility]cre [facility]  . EGD  11/09/2012   01/13/2008, 03/26/2000  . EGD  03/06/2016   Gastritis/No Repeat/MUS  . hemithyroidectomy Right 12/2013  . HERNIORRHAPHY Right   . Right carpal tunnel release    . Right L4-5 microdiscectomy  12/15/2017   Dr Meade Maw  . TUBAL LIGATION       FAMILY HISTORY: Family History  Problem Relation Age of Onset  . Diabetes type II Mother   . Angina Mother   . Cirrhosis Father   . High blood pressure (Hypertension) Maternal Grandfather   . Colon cancer Sister      SOCIAL HISTORY: Social History   Socioeconomic History  . Marital status: Married    Spouse name: Not on file  . Number of children: Not on file  . Years of education: Not on  file  . Highest education level: Not on file  Occupational History  . Not on file  Tobacco Use  . Smoking status: Current Some Day Smoker    Types: Cigarettes  . Smokeless tobacco: Former Systems developer    Quit date: 01/09/2014  . Tobacco comment: uses vape w/ $Remov'0mg'BHTuIK$  of nicotine- Back to smoking 1-3 cigarettes a day  Vaping Use  . Vaping Use: Every day  Substance and Sexual Activity  . Alcohol use: No    Alcohol/week: 0.0 standard drinks    Comment: HEAVY IN THE PAST, OCCASIONALLY NOW  . Drug use: No    Comment: marijuana occasionally, last used Christmas 2012  . Sexual activity: Never  Other Topics Concern  . Not on file  Social History Narrative  . Not on file   Social Determinants of Health   Financial Resource Strain: Not on file  Food Insecurity: Not on file  Transportation Needs: Not on file    PHYSICAL EXAM:  Vitals:   08/15/20 1029  BP: (!) 173/94  Pulse: 72   Body mass index is 19.31 kg/m. Weight: 49.4 kg (109 lb)   GENERAL: Alert, active, oriented x3  HEENT: Pupils equal reactive to light. Extraocular movements are intact. Sclera clear. Palpebral conjunctiva normal red color.Pharynx clear.  NECK: Supple with no palpable mass and no adenopathy.  LUNGS: Sound clear with no rales rhonchi or wheezes.  HEART: Regular rhythm S1 and S2 without murmur.  BREAST: left breast normal without mass, skin or nipple changes or axillary nodes. Right breast with palpable hematoma on the inferior medial aspect. Small bruise on inferior breast. No axillary adenopathy.   ABDOMEN: Soft and depressible, nontender with no palpable mass, no hepatomegaly.  EXTREMITIES: Well-developed well-nourished symmetrical with no dependent edema.  NEUROLOGICAL: Awake alert oriented, facial expression symmetrical, moving all extremities.  REVIEW OF DATA: I have reviewed the following data today: Appointment on 06/18/2020  Component Date Value  . Glucose 06/18/2020 101   . Sodium 06/18/2020 142    . Potassium 06/18/2020 4.1   . Chloride 06/18/2020 107   . Carbon Dioxide (CO2) 06/18/2020 31.6   . Urea Nitrogen (BUN) 06/18/2020 11   . Creatinine 06/18/2020 0.8   . Glomerular Filtration Ra* 06/18/2020 71   . Calcium 06/18/2020 9.3   . AST  06/18/2020 17   . ALT  06/18/2020 9   . Alk Phos (alkaline Phosp* 06/18/2020 67   . Albumin 06/18/2020 4.1   . Bilirubin, Total 06/18/2020 0.3   . Protein, Total 06/18/2020 6.3   . A/G Ratio 06/18/2020 1.9   . WBC (White Blood Cell Co* 06/18/2020 6.9   . RBC (Red Blood Cell Coun* 06/18/2020 4.87   . Hemoglobin 06/18/2020 15.3*  . Hematocrit 06/18/2020 45.7   . MCV (Mean Corpuscular Vo* 06/18/2020 93.8   . MCH (Mean Corpuscular He* 06/18/2020 31.4*  . MCHC (Mean Corpuscular H* 06/18/2020 33.5   . Platelet Count 06/18/2020 370   . RDW-CV (Red Cell Distrib* 06/18/2020 14.0   . MPV (Mean Platelet Volum* 06/18/2020 11.4   . Neutrophils 06/18/2020 4.38   . Lymphocytes 06/18/2020 1.64   . Monocytes 06/18/2020 0.48   . Eosinophils 06/18/2020 0.25   . Basophils 06/18/2020 0.11*  . Neutrophil % 06/18/2020 63.8   . Lymphocyte % 06/18/2020 23.9   . Monocyte % 06/18/2020 7.0   . Eosinophil % 06/18/2020 3.6   . Basophil% 06/18/2020 1.6   . Immature Granulocyte % 06/18/2020 0.1   . Immature Granulocyte Cou* 06/18/2020 0.01   . Cholesterol, Total 06/18/2020 191   . Triglyceride 06/18/2020 138   . HDL (High Density Lipopr* 06/18/2020 38.8   . LDL Calculated 06/18/2020 125   . VLDL Cholesterol 06/18/2020 28   . Cholesterol/HDL Ratio 06/18/2020 4.9   . Color 06/18/2020 Yellow   . Clarity 06/18/2020 Clear   . Specific Gravity 06/18/2020 1.020   . pH, Urine 06/18/2020 6.0   . Protein, Urinalysis 06/18/2020 Negative   . Glucose, Urinalysis 06/18/2020 Negative   . Ketones, Urinalysis 06/18/2020 Negative   . Blood, Urinalysis 06/18/2020 Negative   . Nitrite, Urinalysis 06/18/2020 Negative   . Leukocyte Esterase, Urin* 06/18/2020 Negative   . White  Blood Cells, Urina* 06/18/2020 0-3   . Red Blood Cells, Urinaly* 06/18/2020 None Seen   . Bacteria, Urinalysis 06/18/2020 Rare*  . Squamous Epithelial Cell* 06/18/2020 Rare   . Thyroid Stimulating Horm* 06/18/2020 4.048   . Vitamin B12 06/18/2020 >1,500   . Vitamin D,  25-Hydroxy - * 06/18/2020 45.7      ASSESSMENT: Ms. Kregel is a 69 y.o. female presenting for consultation for intraductal papilloma.    Patient was oriented again about the pathology results. Surgical alternatives were discussed with patient including excisional biopsy for diagnostic purposes to rule out malignancy. Surgical technique and post operative care was discussed with patient. Risk of surgery was discussed with patient including but not limited to: wound infection, seroma, hematoma, brachial plexopathy, mondor's disease (thrombosis of small veins of breast), chronic wound pain, breast lymphedema, altered sensation to the nipple and cosmesis among others.   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 Radiofrequency guided excisional biopsy of the right breast (19125) 2. CBC, CMP done 3. Avoid taking aspirin 5 days before surgery 4. Contact us if you have any concernn  Patient verbalized understanding, all questions were answered, and were agreeable with the plan outlined above.     Herbert Pun, MD  Electronically signed by Herbert Pun, MD

## 2020-08-20 ENCOUNTER — Other Ambulatory Visit
Admission: RE | Admit: 2020-08-20 | Discharge: 2020-08-20 | Disposition: A | Discharge status: Home / Self Care | Payer: PPO | Source: Ambulatory Visit | Attending: General Surgery | Admitting: General Surgery

## 2020-08-20 ENCOUNTER — Other Ambulatory Visit: Payer: Self-pay

## 2020-08-20 DIAGNOSIS — Z01810 Encounter for preprocedural cardiovascular examination: Secondary | ICD-10-CM | POA: Diagnosis not present

## 2020-08-20 NOTE — Patient Instructions (Addendum)
Your procedure is scheduled on: Monday, November 8 Report to Day Surgery on the 2nd floor of the [facility]. To find out your arrival time, please call [phone] between 1PM - 3PM on: Friday, November 5  REMEMBER: Instructions that are not followed completely may result in serious medical risk, up to and including death; or upon the discretion of your surgeon and anesthesiologist your surgery may need to be rescheduled.  Do not eat food after midnight the night before surgery.  No gum chewing, lozengers or hard candies.  You may however, drink CLEAR liquids up to 2 hours before you are scheduled to arrive for your surgery. Do not drink anything within 2 hours of your scheduled arrival time.  Clear liquids include: - water  - apple juice without pulp - gatorade (not RED, PURPLE, OR BLUE) - black coffee or tea (Do NOT add milk or creamers to the coffee or tea) Do NOT drink anything that is not on this list.  TAKE THESE MEDICATIONS THE MORNING OF SURGERY WITH A SIP OF WATER:  1.  symbicort inhaler 2.  Metoprolol 3.  Pantoprazole - (take one the night before and one on the morning of surgery - helps to prevent nausea after surgery.)  Use inhalers on the day of surgery and bring to the hospital.  One week prior to surgery: Stop Anti-inflammatories (NSAIDS) such as Advil, Aleve, Ibuprofen, Motrin, Naproxen, Naprosyn and Aspirin based products such as Excedrin, Goodys Powder, BC Powder. Stop ANY OVER THE COUNTER supplements until after surgery. (You may continue taking Tylenol, Vitamin D, Vitamin B, and multivitamin.)  No Alcohol for 24 hours before or after surgery.  No Smoking including e-cigarettes for 24 hours prior to surgery.  No chewable tobacco products for at least 6 hours prior to surgery.  No nicotine patches on the day of surgery.  Do not use any "recreational" drugs for at least a week prior to your surgery.  Please be advised that the combination of cocaine and  anesthesia may have negative outcomes, up to and including death. If you test positive for cocaine, your surgery will be cancelled.  On the morning of surgery brush your teeth with toothpaste and water, you may rinse your mouth with mouthwash if you wish. Do not swallow any toothpaste or mouthwash.  Do not wear jewelry, make-up, hairpins, clips or nail polish.  Do not wear lotions, powders, or perfumes.   Do not shave 48 hours prior to surgery.   Contact lenses, hearing aids and dentures may not be worn into surgery.  Do not bring valuables to the hospital. [facility] is not responsible for any missing/lost belongings or valuables.   Use CHG Soap as directed on instruction sheet.  Notify your doctor if there is any change in your medical condition (cold, fever, infection).  Wear comfortable clothing (specific to your surgery type) to the hospital.  Plan for stool softeners for home use; pain medications have a tendency to cause constipation. You can also help prevent constipation by eating foods high in fiber such as fruits and vegetables and drinking plenty of fluids as your diet allows.  After surgery, you can help prevent lung complications by doing breathing exercises.  Take deep breaths and cough every 1-2 hours. Your doctor may order a device called an Incentive Spirometer to help you take deep breaths.  If you are being discharged the day of surgery, you will not be allowed to drive home. You will need a responsible adult (18  years or older) to drive you home and stay with you that night.   If you are taking public transportation, you will need to have a responsible adult (18 years or older) with you. Please confirm with your physician that it is acceptable to use public transportation.   Please call the [place] Dept. at [phone] if you have any questions about these instructions.  Visitation Policy:  Patients undergoing a surgery or procedure may  have one family member or support person with them as long as that person is not COVID-19 positive or experiencing its symptoms.  That person may remain in the waiting area during the procedure.  Masking is required regardless of vaccination status.  Systemwide, no visitors 17 or younger.

## 2020-08-21 ENCOUNTER — Ambulatory Visit
Admission: RE | Admit: 2020-08-21 | Discharge: 2020-08-21 | Disposition: A | Discharge status: Home / Self Care | Payer: PPO | Source: Ambulatory Visit | Attending: General Surgery | Admitting: General Surgery

## 2020-08-21 ENCOUNTER — Encounter
Admission: RE | Admit: 2020-08-21 | Discharge: 2020-08-21 | Disposition: A | Discharge status: Home / Self Care | Payer: PPO | Source: Ambulatory Visit | Attending: General Surgery | Admitting: General Surgery

## 2020-08-21 DIAGNOSIS — D241 Benign neoplasm of right breast: Secondary | ICD-10-CM | POA: Insufficient documentation

## 2020-08-21 DIAGNOSIS — R928 Other abnormal and inconclusive findings on diagnostic imaging of breast: Secondary | ICD-10-CM | POA: Diagnosis not present

## 2020-08-22 ENCOUNTER — Other Ambulatory Visit: Payer: Self-pay

## 2020-08-22 ENCOUNTER — Other Ambulatory Visit
Admission: RE | Admit: 2020-08-22 | Discharge: 2020-08-22 | Disposition: A | Discharge status: Home / Self Care | Payer: PPO | Source: Ambulatory Visit | Attending: General Surgery | Admitting: General Surgery

## 2020-08-22 DIAGNOSIS — Z01812 Encounter for preprocedural laboratory examination: Secondary | ICD-10-CM | POA: Insufficient documentation

## 2020-08-22 DIAGNOSIS — Z20822 Contact with and (suspected) exposure to covid-19: Secondary | ICD-10-CM | POA: Diagnosis not present

## 2020-08-22 LAB — SARS CORONAVIRUS 2 (TAT 6-24 HRS): SARS Coronavirus 2: NEGATIVE

## 2020-08-25 MED ORDER — CHLORHEXIDINE GLUCONATE 0.12 % MT SOLN: 15.0000 mL | Freq: Once | OROMUCOSAL | Status: AC

## 2020-08-25 MED ORDER — LACTATED RINGERS IV SOLN: INTRAVENOUS | Status: DC

## 2020-08-25 MED ORDER — ORAL CARE MOUTH RINSE: 15.0000 mL | Freq: Once | OROMUCOSAL | Status: AC

## 2020-08-25 MED ORDER — CEFAZOLIN SODIUM-DEXTROSE 2-4 GM/100ML-% IV SOLN
2.0000 g | INTRAVENOUS | Status: AC
  Administered 2020-08-26: 2 g via INTRAVENOUS

## 2020-08-26 ENCOUNTER — Ambulatory Visit: Payer: PPO | Admitting: Certified Registered"

## 2020-08-26 ENCOUNTER — Encounter: Payer: Self-pay | Admitting: General Surgery

## 2020-08-26 ENCOUNTER — Other Ambulatory Visit: Payer: Self-pay

## 2020-08-26 ENCOUNTER — Encounter
Admission: RE | Disposition: A | Discharge status: Home / Self Care | Payer: Self-pay | Source: Home / Self Care | Attending: General Surgery

## 2020-08-26 ENCOUNTER — Ambulatory Visit
Admission: RE | Admit: 2020-08-26 | Discharge: 2020-08-26 | Disposition: A | Discharge status: Home / Self Care | Payer: PPO | Source: Ambulatory Visit | Attending: General Surgery | Admitting: General Surgery

## 2020-08-26 ENCOUNTER — Ambulatory Visit
Admission: RE | Admit: 2020-08-26 | Discharge: 2020-08-26 | Disposition: A | Discharge status: Home / Self Care | Payer: PPO | Attending: General Surgery | Admitting: General Surgery

## 2020-08-26 DIAGNOSIS — N6489 Other specified disorders of breast: Secondary | ICD-10-CM | POA: Diagnosis not present

## 2020-08-26 DIAGNOSIS — Z880 Allergy status to penicillin: Secondary | ICD-10-CM | POA: Diagnosis not present

## 2020-08-26 DIAGNOSIS — D241 Benign neoplasm of right breast: Secondary | ICD-10-CM | POA: Insufficient documentation

## 2020-08-26 DIAGNOSIS — F17210 Nicotine dependence, cigarettes, uncomplicated: Secondary | ICD-10-CM | POA: Insufficient documentation

## 2020-08-26 DIAGNOSIS — N6021 Fibroadenosis of right breast: Secondary | ICD-10-CM | POA: Diagnosis not present

## 2020-08-26 DIAGNOSIS — Z803 Family history of malignant neoplasm of breast: Secondary | ICD-10-CM | POA: Diagnosis not present

## 2020-08-26 DIAGNOSIS — Z79899 Other long term (current) drug therapy: Secondary | ICD-10-CM | POA: Diagnosis not present

## 2020-08-26 DIAGNOSIS — Z881 Allergy status to other antibiotic agents status: Secondary | ICD-10-CM | POA: Diagnosis not present

## 2020-08-26 HISTORY — PX: BREAST BIOPSY WITH RADIO FREQUENCY LOCALIZER: SHX6895

## 2020-08-26 LAB — URINE DRUG SCREEN, QUALITATIVE (ARMC ONLY)
Amphetamines, Ur Screen: NOT DETECTED
Barbiturates, Ur Screen: NOT DETECTED
Benzodiazepine, Ur Scrn: NOT DETECTED
Cannabinoid 50 Ng, Ur ~~LOC~~: POSITIVE — AB
Cocaine Metabolite,Ur ~~LOC~~: NOT DETECTED
MDMA (Ecstasy)Ur Screen: NOT DETECTED
Methadone Scn, Ur: NOT DETECTED
Opiate, Ur Screen: NOT DETECTED
Phencyclidine (PCP) Ur S: NOT DETECTED
Tricyclic, Ur Screen: POSITIVE — AB

## 2020-08-26 SURGERY — BREAST BIOPSY WITH RADIO FREQUENCY LOCALIZER
Anesthesia: General | Site: Breast | Laterality: Right

## 2020-08-26 MED ORDER — SUCCINYLCHOLINE CHLORIDE 20 MG/ML IJ SOLN
INTRAMUSCULAR | Status: DC | PRN
  Administered 2020-08-26: 80 mg via INTRAVENOUS

## 2020-08-26 MED ORDER — ROCURONIUM BROMIDE 10 MG/ML (PF) SYRINGE
PREFILLED_SYRINGE | INTRAVENOUS | Status: AC
  Filled 2020-08-26: qty 20

## 2020-08-26 MED ORDER — BUPIVACAINE-EPINEPHRINE (PF) 0.5% -1:200000 IJ SOLN
INTRAMUSCULAR | Status: AC
  Filled 2020-08-26: qty 30

## 2020-08-26 MED ORDER — DEXAMETHASONE SODIUM PHOSPHATE 10 MG/ML IJ SOLN
INTRAMUSCULAR | Status: AC
  Filled 2020-08-26: qty 2

## 2020-08-26 MED ORDER — CHLORHEXIDINE GLUCONATE 0.12 % MT SOLN
OROMUCOSAL | Status: AC
  Administered 2020-08-26: 15 mL via OROMUCOSAL
  Filled 2020-08-26: qty 15

## 2020-08-26 MED ORDER — ESMOLOL HCL 100 MG/10ML IV SOLN
INTRAVENOUS | Status: DC | PRN
  Administered 2020-08-26: 25 mg via INTRAVENOUS

## 2020-08-26 MED ORDER — FENTANYL CITRATE (PF) 100 MCG/2ML IJ SOLN
INTRAMUSCULAR | Status: DC | PRN
  Administered 2020-08-26: 25 ug via INTRAVENOUS
  Administered 2020-08-26: 50 ug via INTRAVENOUS
  Administered 2020-08-26: 25 ug via INTRAVENOUS

## 2020-08-26 MED ORDER — SUGAMMADEX SODIUM 200 MG/2ML IV SOLN
INTRAVENOUS | Status: DC | PRN
  Administered 2020-08-26: 200 mg via INTRAVENOUS

## 2020-08-26 MED ORDER — OXYCODONE HCL 5 MG PO TABS
5.0000 mg | ORAL_TABLET | Freq: Once | ORAL | Status: AC | PRN
  Administered 2020-08-26: 5 mg via ORAL

## 2020-08-26 MED ORDER — OXYCODONE HCL 5 MG/5ML PO SOLN: 5.0000 mg | Freq: Once | ORAL | Status: AC | PRN

## 2020-08-26 MED ORDER — HYDROMORPHONE HCL 1 MG/ML IJ SOLN: 0.2500 mg | INTRAMUSCULAR | Status: DC | PRN

## 2020-08-26 MED ORDER — MIDAZOLAM HCL 2 MG/2ML IJ SOLN
INTRAMUSCULAR | Status: DC | PRN
  Administered 2020-08-26: 2 mg via INTRAVENOUS

## 2020-08-26 MED ORDER — BUPIVACAINE-EPINEPHRINE (PF) 0.5% -1:200000 IJ SOLN
INTRAMUSCULAR | Status: DC | PRN
  Administered 2020-08-26: 15 mL

## 2020-08-26 MED ORDER — MEPERIDINE HCL 50 MG/ML IJ SOLN: 6.2500 mg | INTRAMUSCULAR | Status: DC | PRN

## 2020-08-26 MED ORDER — FENTANYL CITRATE (PF) 100 MCG/2ML IJ SOLN
INTRAMUSCULAR | Status: AC
  Filled 2020-08-26: qty 2

## 2020-08-26 MED ORDER — GLYCOPYRROLATE 0.2 MG/ML IJ SOLN
INTRAMUSCULAR | Status: AC
  Filled 2020-08-26: qty 2

## 2020-08-26 MED ORDER — ROCURONIUM BROMIDE 100 MG/10ML IV SOLN
INTRAVENOUS | Status: DC | PRN
  Administered 2020-08-26: 10 mg via INTRAVENOUS
  Administered 2020-08-26: 30 mg via INTRAVENOUS

## 2020-08-26 MED ORDER — DEXAMETHASONE SODIUM PHOSPHATE 10 MG/ML IJ SOLN
INTRAMUSCULAR | Status: DC | PRN
  Administered 2020-08-26: 10 mg via INTRAVENOUS

## 2020-08-26 MED ORDER — GLYCOPYRROLATE 0.2 MG/ML IJ SOLN
INTRAMUSCULAR | Status: DC | PRN
  Administered 2020-08-26: .2 mg via INTRAVENOUS

## 2020-08-26 MED ORDER — CEFAZOLIN SODIUM-DEXTROSE 2-4 GM/100ML-% IV SOLN
INTRAVENOUS | Status: AC
  Filled 2020-08-26: qty 100

## 2020-08-26 MED ORDER — ONDANSETRON HCL 4 MG/2ML IJ SOLN
INTRAMUSCULAR | Status: DC | PRN
  Administered 2020-08-26: 4 mg via INTRAVENOUS

## 2020-08-26 MED ORDER — MIDAZOLAM HCL 2 MG/2ML IJ SOLN
INTRAMUSCULAR | Status: AC
  Filled 2020-08-26: qty 2

## 2020-08-26 MED ORDER — DROPERIDOL 2.5 MG/ML IJ SOLN
0.6250 mg | Freq: Once | INTRAMUSCULAR | Status: DC | PRN
  Filled 2020-08-26: qty 2

## 2020-08-26 MED ORDER — ACETAMINOPHEN 10 MG/ML IV SOLN
INTRAVENOUS | Status: AC
  Filled 2020-08-26: qty 100

## 2020-08-26 MED ORDER — SUCCINYLCHOLINE CHLORIDE 200 MG/10ML IV SOSY
PREFILLED_SYRINGE | INTRAVENOUS | Status: AC
  Filled 2020-08-26: qty 20

## 2020-08-26 MED ORDER — ACETAMINOPHEN 10 MG/ML IV SOLN
INTRAVENOUS | Status: DC | PRN
  Administered 2020-08-26: 500 mg via INTRAVENOUS

## 2020-08-26 MED ORDER — PROMETHAZINE HCL 25 MG/ML IJ SOLN: 6.2500 mg | INTRAMUSCULAR | Status: DC | PRN

## 2020-08-26 MED ORDER — HYDROCODONE-ACETAMINOPHEN 5-325 MG PO TABS: 1.0000 | ORAL_TABLET | ORAL | 0 refills | Status: AC | PRN

## 2020-08-26 MED ORDER — PROPOFOL 10 MG/ML IV BOLUS
INTRAVENOUS | Status: DC | PRN
  Administered 2020-08-26: 100 mg via INTRAVENOUS

## 2020-08-26 MED ORDER — DEXMEDETOMIDINE (PRECEDEX) IN NS 20 MCG/5ML (4 MCG/ML) IV SYRINGE
PREFILLED_SYRINGE | INTRAVENOUS | Status: AC
  Filled 2020-08-26: qty 5

## 2020-08-26 MED ORDER — OXYCODONE HCL 5 MG PO TABS
ORAL_TABLET | ORAL | Status: AC
  Filled 2020-08-26: qty 1

## 2020-08-26 MED ORDER — LIDOCAINE HCL (CARDIAC) PF 100 MG/5ML IV SOSY
PREFILLED_SYRINGE | INTRAVENOUS | Status: DC | PRN
  Administered 2020-08-26: 100 mg via INTRAVENOUS

## 2020-08-26 MED ORDER — KETOROLAC TROMETHAMINE 30 MG/ML IJ SOLN
INTRAMUSCULAR | Status: AC
  Filled 2020-08-26: qty 1

## 2020-08-26 MED ORDER — ONDANSETRON HCL 4 MG/2ML IJ SOLN
INTRAMUSCULAR | Status: AC
  Filled 2020-08-26: qty 8

## 2020-08-26 SURGICAL SUPPLY — 48 items
ADH SKN CLS APL DERMABOND .7 (GAUZE/BANDAGES/DRESSINGS) ×1
APL PRP STRL LF DISP 70% ISPRP (MISCELLANEOUS) ×1
BLADE SURG 15 STRL LF DISP TIS (BLADE) ×1 IMPLANT
BLADE SURG 15 STRL SS (BLADE) ×3
CANISTER SUCT 1200ML W/VALVE (MISCELLANEOUS) ×1 IMPLANT
CHLORAPREP W/TINT 26 (MISCELLANEOUS) ×3 IMPLANT
CNTNR SPEC 2.5X3XGRAD LEK (MISCELLANEOUS)
CONT SPEC 4OZ STER OR WHT (MISCELLANEOUS)
CONT SPEC 4OZ STRL OR WHT (MISCELLANEOUS)
CONTAINER SPEC 2.5X3XGRAD LEK (MISCELLANEOUS) ×1 IMPLANT
COVER WAND RF STERILE (DRAPES) ×3 IMPLANT
DERMABOND ADVANCED (GAUZE/BANDAGES/DRESSINGS) ×2
DERMABOND ADVANCED .7 DNX12 (GAUZE/BANDAGES/DRESSINGS) ×1 IMPLANT
DEVICE DUBIN SPECIMEN MAMMOGRA (MISCELLANEOUS) ×3 IMPLANT
DRAPE LAPAROTOMY TRNSV 106X77 (MISCELLANEOUS) ×3 IMPLANT
ELECT CAUTERY BLADE TIP 2.5 (TIP) ×3
ELECT REM PT RETURN 9FT ADLT (ELECTROSURGICAL) ×3
ELECTRODE CAUTERY BLDE TIP 2.5 (TIP) ×1 IMPLANT
ELECTRODE REM PT RTRN 9FT ADLT (ELECTROSURGICAL) ×1 IMPLANT
GLOVE BIO SURGEON STRL SZ 6.5 (GLOVE) ×2 IMPLANT
GLOVE BIO SURGEONS STRL SZ 6.5 (GLOVE) ×1
GLOVE BIOGEL PI IND STRL 6.5 (GLOVE) ×1 IMPLANT
GLOVE BIOGEL PI INDICATOR 6.5 (GLOVE) ×2
GOWN STRL REUS W/ TWL LRG LVL3 (GOWN DISPOSABLE) ×3 IMPLANT
GOWN STRL REUS W/TWL LRG LVL3 (GOWN DISPOSABLE) ×9
KIT MARKER MARGIN INK (KITS) ×2 IMPLANT
KIT TURNOVER KIT A (KITS) ×3 IMPLANT
LABEL OR SOLS (LABEL) ×1 IMPLANT
MANIFOLD NEPTUNE II (INSTRUMENTS) ×3 IMPLANT
MARGIN MAP 10MM (MISCELLANEOUS) IMPLANT
MARKER MARGIN CORRECT CLIP (MARKER) ×2 IMPLANT
NDL HYPO 25X1 1.5 SAFETY (NEEDLE) ×1 IMPLANT
NEEDLE HYPO 25X1 1.5 SAFETY (NEEDLE) ×3 IMPLANT
PACK BASIN MINOR (MISCELLANEOUS) ×3 IMPLANT
RETRACTOR RING XSMALL (MISCELLANEOUS) IMPLANT
RTRCTR WOUND ALEXIS 13CM XS SH (MISCELLANEOUS) ×3
SET LOCALIZER 20 PROBE US (MISCELLANEOUS) ×3 IMPLANT
SUT ETHILON 3-0 FS-10 30 BLK (SUTURE)
SUT MNCRL 4-0 (SUTURE) ×3
SUT MNCRL 4-0 27XMFL (SUTURE) ×1
SUT SILK 2 0 SH (SUTURE) ×3 IMPLANT
SUT VIC AB 3-0 SH 27 (SUTURE) ×3
SUT VIC AB 3-0 SH 27X BRD (SUTURE) ×1 IMPLANT
SUTURE EHLN 3-0 FS-10 30 BLK (SUTURE) IMPLANT
SUTURE MNCRL 4-0 27XMF (SUTURE) ×1 IMPLANT
SYR 10ML LL (SYRINGE) ×3 IMPLANT
SYR BULB IRRIG 60ML STRL (SYRINGE) ×3 IMPLANT
WATER STERILE IRR 1000ML POUR (IV SOLUTION) ×3 IMPLANT

## 2020-08-26 NOTE — Interval H&P Note (Signed)
History and Physical Interval Note:  08/26/2020 8:27 AM  Tina Bradley  has presented today for surgery, with the diagnosis of D24.1 Intraductal papilloma of breast, right.  The various methods of treatment have been discussed with the patient and family. After consideration of risks, benefits and other options for treatment, the patient has consented to  Procedure(s): Excisional BREAST BIOPSY WITH RADIO FREQUENCY LOCALIZER (Right) as a surgical intervention.  The patient's history has been reviewed, patient examined, no change in status, stable for surgery.  I have reviewed the patient's chart and labs.  Questions were answered to the patient's satisfaction.     Herbert Pun

## 2020-08-26 NOTE — Discharge Instructions (Signed)
  Diet: Resume home heart healthy regular diet.   Activity: Increase activity as tolerated. Light activity and walking are encouraged. Do not drive or drink alcohol if taking narcotic pain medications.  Wound care: May shower with soapy water and pat dry (do not rub incisions), but no baths or submerging incision underwater until follow-up. (no swimming)   Medications: Resume all home medications. For mild to moderate pain: acetaminophen (Tylenol) or ibuprofen (if no kidney disease). Combining Tylenol with alcohol can substantially increase your risk of causing liver disease. Narcotic pain medications, if prescribed, can be used for severe pain, though may cause nausea, constipation, and drowsiness. Do not combine Tylenol and Norco within a 6 hour period as Norco contains Tylenol. If you do not need the narcotic pain medication, you do not need to fill the prescription.  Call office [phone]) at any time if any questions, worsening pain, fevers/chills, bleeding, drainage from incision site, or other concerns.  AMBULATORY SURGERY  DISCHARGE INSTRUCTIONS   1) The drugs that you were given will stay in your system until tomorrow so for the next 24 hours you should not:  A) Drive an automobile B) Make any legal decisions C) Drink any alcoholic beverage   2) You may resume regular meals tomorrow.  Today it is better to start with liquids and gradually work up to solid foods.  You may eat anything you prefer, but it is better to start with liquids, then soup and crackers, and gradually work up to solid foods.   3) Please notify your doctor immediately if you have any unusual bleeding, trouble breathing, redness and pain at the surgery site, drainage, fever, or pain not relieved by medication.    4) Additional Instructions:  1 part rubbing alcohol 2 parts water in small zi-plock (double bag).  Lacap.( will remain soft)  Wrap in cloth and apply to wound  Stool softner 2 times a  day  Please contact your physician with any problems or Same Day Surgery at [phone], Monday through Friday 6 am to 4 pm, or [place] at [facility] number at [phone].

## 2020-08-26 NOTE — Op Note (Signed)
Preoperative diagnosis: Right breast intraductal papilloma.  Postoperative diagnosis: Right breast intraductal papilloma.   Procedure: Right radiofrequency tag-localized excisional biopsy   Anesthesia: GETA  Surgeon: Dr. Windell Moment  Wound Classification: Clean  Indications: Patient is a 69 y.o. female with a nonpalpable right breast distortion noted on mammography with core biopsy demonstrating intraductal papilloma requires radiofrequency tag-localized excisional biopsy to rule out malignancy.   Findings: 1. Specimen mammography shows marker and tag on specimen 2. No other palpable mass or lymph node identified.   Description of procedure: Preoperative radiofrequency tag localization was performed by radiology. Localization studies were reviewed. The patient was taken to the operating room and placed supine on the operating table, and after general anesthesia the right chest and axilla were prepped and draped in the usual sterile fashion. A time-out was completed verifying correct patient, procedure, site, positioning, and implant(s) and/or special equipment prior to beginning this procedure.  By comparing the localization studies and interrogation with Localizer device, the probable trajectory and location of the mass was visualized. A circumareolar skin incision was planned in such a way as to minimize the amount of dissection to reach the mass.  The skin incision was made. Flaps were raised and the location of the tag was confirmed with Localizer device confirmed. A 2-0 silk figure-of-eight stay suture was placed and used for retraction. Dissection was then taken down circumferentially, taking care to include the entire localizing tag and a wide margin of grossly normal tissue. The specimen and entire localizing tag were removed. The specimen was oriented and sent to radiology with the localization studies. Confirmation was received that the entire target lesion had been resected. The  wound was irrigated. Hemostasis was checked. The wound was closed with interrupted sutures of 3-0 Vicryl and a subcuticular suture of Monocryl 3-0. No attempt was made to close the dead space.    Specimen: Right Breast excisional biopsy               Complications: None  Estimated Blood Loss: 5 mL

## 2020-08-26 NOTE — Anesthesia Preprocedure Evaluation (Addendum)
Anesthesia Evaluation  Patient identified by MRN, date of birth, ID band Patient awake    Reviewed: reviewed documented beta blocker date and time   Airway Mallampati: II       Dental  (+) Poor Dentition, Implants Lower teeth - implants:   Pulmonary COPD,  COPD inhaler, Current Smoker and Patient abstained from smoking.,    Pulmonary exam normal        Cardiovascular hypertension, Normal cardiovascular exam     Neuro/Psych PSYCHIATRIC DISORDERS Anxiety Depression  Neuromuscular disease    GI/Hepatic hiatal hernia, GERD  Medicated and Controlled,(+) Hepatitis -, C  Endo/Other    Renal/GU      Musculoskeletal   Abdominal   Peds  Hematology  (+) Blood dyscrasia, anemia ,   Anesthesia Other Findings   Reproductive/Obstetrics                            Anesthesia Physical Anesthesia Plan  ASA: II  Anesthesia Plan: General   Post-op Pain Management:    Induction: Intravenous  PONV Risk Score and Plan: 2 and Ondansetron and Dexamethasone  Airway Management Planned: Oral ETT  Additional Equipment:   Intra-op Plan:   Post-operative Plan: Extubation in OR  Informed Consent: I have reviewed the patients History and Physical, chart, labs and discussed the procedure including the risks, benefits and alternatives for the proposed anesthesia with the patient or authorized representative who has indicated his/her understanding and acceptance.       Plan Discussed with: CRNA, Surgeon and Anesthesiologist  Anesthesia Plan Comments:         Anesthesia Quick Evaluation

## 2020-08-26 NOTE — OR Nursing (Signed)
S. Holmes RN preop nurse witness the patient put both earrings in her black purse before she went to surgery.

## 2020-08-26 NOTE — OR Nursing (Signed)
Patient called to say she has one diamond earring missing.  Searched linen and possession bag and searched the floor in post op room.  None found.

## 2020-08-26 NOTE — Anesthesia Postprocedure Evaluation (Signed)
Anesthesia Post Note  Patient: Tina Bradley  Procedure(s) Performed: Excisional BREAST BIOPSY WITH RADIO FREQUENCY LOCALIZER (Right Breast)  Patient location during evaluation: PACU Anesthesia Type: General Level of consciousness: awake and awake and alert Pain management: satisfactory to patient Vital Signs Assessment: post-procedure vital signs reviewed and stable Respiratory status: spontaneous breathing and nonlabored ventilation Cardiovascular status: blood pressure returned to baseline Postop Assessment: no headache and no apparent nausea or vomiting Anesthetic complications: no   No complications documented.   Last Vitals:  Vitals:   08/26/20 1015 08/26/20 1029  BP: (!) 146/86 (!) 154/85  Pulse: 76 79  Resp: 18 15  Temp:  (!) 36.1 C  SpO2: 98% 100%    Last Pain:  Vitals:   08/26/20 1029  TempSrc:   PainSc: 4                  Neva Seat

## 2020-08-26 NOTE — Transfer of Care (Signed)
Immediate Anesthesia Transfer of Care Note  Patient: Halcyon K Garceau  Procedure(s) Performed: Excisional BREAST BIOPSY WITH RADIO FREQUENCY LOCALIZER (Right Breast)  Patient Location: PACU  Anesthesia Type:General  Level of Consciousness: awake, alert , oriented and patient cooperative  Airway & Oxygen Therapy: Patient Spontanous Breathing  Post-op Assessment: Report given to RN and Post -op Vital signs reviewed and stable  Post vital signs: Reviewed and stable  Last Vitals:  Vitals Value Taken Time  BP 155/70 08/26/20 0948  Temp 36.3 C 08/26/20 0948  Pulse 91 08/26/20 0951  Resp 14 08/26/20 0951  SpO2 98 % 08/26/20 0951  Vitals shown include unvalidated device data.  Last Pain:  Vitals:   08/26/20 0803  TempSrc: Oral  PainSc: 0-No pain         Complications: No complications documented.Pt noted coughing frequently, stating she has nasal drainage and "allergy flare-up" which she just notified staff, 2 puffs of her Symbicort inhaler, MDA notified to further eval, pt remains stable, RA sats >99%, patent airway TFH

## 2020-08-26 NOTE — Anesthesia Procedure Notes (Signed)
Procedure Name: Intubation Performed by: Fletcher-Harrison, Adeel Guiffre, CRNA Pre-anesthesia Checklist: Patient identified, Emergency Drugs available, Suction available and Patient being monitored Patient Re-evaluated:Patient Re-evaluated prior to induction Oxygen Delivery Method: Circle system utilized Preoxygenation: Pre-oxygenation with 100% oxygen Induction Type: IV induction Ventilation: Mask ventilation without difficulty Laryngoscope Size: McGraph and 3 Grade View: Grade I Tube type: Oral Number of attempts: 1 Airway Equipment and Method: Stylet and Oral airway Placement Confirmation: ETT inserted through vocal cords under direct vision,  positive ETCO2,  breath sounds checked- equal and bilateral and CO2 detector Secured at: 21 cm Tube secured with: Tape Dental Injury: Teeth and Oropharynx as per pre-operative assessment        

## 2020-08-27 LAB — SURGICAL PATHOLOGY

## 2020-09-04 DIAGNOSIS — L638 Other alopecia areata: Secondary | ICD-10-CM | POA: Diagnosis not present

## 2020-09-04 DIAGNOSIS — L570 Actinic keratosis: Secondary | ICD-10-CM | POA: Diagnosis not present

## 2020-11-26 DIAGNOSIS — J40 Bronchitis, not specified as acute or chronic: Secondary | ICD-10-CM | POA: Diagnosis not present

## 2020-12-06 DIAGNOSIS — J329 Chronic sinusitis, unspecified: Secondary | ICD-10-CM | POA: Diagnosis not present

## 2020-12-06 DIAGNOSIS — J40 Bronchitis, not specified as acute or chronic: Secondary | ICD-10-CM | POA: Diagnosis not present

## 2020-12-11 DIAGNOSIS — H2513 Age-related nuclear cataract, bilateral: Secondary | ICD-10-CM | POA: Diagnosis not present

## 2020-12-11 DIAGNOSIS — H5203 Hypermetropia, bilateral: Secondary | ICD-10-CM | POA: Diagnosis not present

## 2020-12-11 DIAGNOSIS — H16223 Keratoconjunctivitis sicca, not specified as Sjogren's, bilateral: Secondary | ICD-10-CM | POA: Diagnosis not present

## 2020-12-17 DIAGNOSIS — E079 Disorder of thyroid, unspecified: Secondary | ICD-10-CM | POA: Diagnosis not present

## 2020-12-30 DIAGNOSIS — J449 Chronic obstructive pulmonary disease, unspecified: Secondary | ICD-10-CM | POA: Diagnosis not present

## 2020-12-30 DIAGNOSIS — Z720 Tobacco use: Secondary | ICD-10-CM | POA: Diagnosis not present

## 2020-12-30 DIAGNOSIS — M109 Gout, unspecified: Secondary | ICD-10-CM | POA: Diagnosis not present

## 2020-12-30 DIAGNOSIS — Z0000 Encounter for general adult medical examination without abnormal findings: Secondary | ICD-10-CM | POA: Diagnosis not present

## 2020-12-30 DIAGNOSIS — E079 Disorder of thyroid, unspecified: Secondary | ICD-10-CM | POA: Diagnosis not present

## 2020-12-30 DIAGNOSIS — F17210 Nicotine dependence, cigarettes, uncomplicated: Secondary | ICD-10-CM | POA: Diagnosis not present

## 2020-12-30 DIAGNOSIS — I10 Essential (primary) hypertension: Secondary | ICD-10-CM | POA: Diagnosis not present

## 2020-12-30 DIAGNOSIS — E559 Vitamin D deficiency, unspecified: Secondary | ICD-10-CM | POA: Diagnosis not present

## 2020-12-30 DIAGNOSIS — F32A Depression, unspecified: Secondary | ICD-10-CM | POA: Diagnosis not present

## 2021-01-06 DIAGNOSIS — D485 Neoplasm of uncertain behavior of skin: Secondary | ICD-10-CM | POA: Diagnosis not present

## 2021-01-06 DIAGNOSIS — L578 Other skin changes due to chronic exposure to nonionizing radiation: Secondary | ICD-10-CM | POA: Diagnosis not present

## 2021-01-06 DIAGNOSIS — L570 Actinic keratosis: Secondary | ICD-10-CM | POA: Diagnosis not present

## 2021-01-06 DIAGNOSIS — Z872 Personal history of diseases of the skin and subcutaneous tissue: Secondary | ICD-10-CM | POA: Diagnosis not present

## 2021-01-06 DIAGNOSIS — C44319 Basal cell carcinoma of skin of other parts of face: Secondary | ICD-10-CM | POA: Diagnosis not present

## 2021-01-06 DIAGNOSIS — Z85828 Personal history of other malignant neoplasm of skin: Secondary | ICD-10-CM | POA: Diagnosis not present

## 2021-01-21 DIAGNOSIS — H0288B Meibomian gland dysfunction left eye, upper and lower eyelids: Secondary | ICD-10-CM | POA: Diagnosis not present

## 2021-01-21 DIAGNOSIS — H16223 Keratoconjunctivitis sicca, not specified as Sjogren's, bilateral: Secondary | ICD-10-CM | POA: Diagnosis not present

## 2021-01-21 DIAGNOSIS — H0288A Meibomian gland dysfunction right eye, upper and lower eyelids: Secondary | ICD-10-CM | POA: Diagnosis not present

## 2021-01-30 DIAGNOSIS — C4491 Basal cell carcinoma of skin, unspecified: Secondary | ICD-10-CM | POA: Diagnosis not present

## 2021-01-30 DIAGNOSIS — C44319 Basal cell carcinoma of skin of other parts of face: Secondary | ICD-10-CM | POA: Diagnosis not present

## 2021-04-01 DIAGNOSIS — J449 Chronic obstructive pulmonary disease, unspecified: Secondary | ICD-10-CM | POA: Diagnosis not present

## 2021-04-01 DIAGNOSIS — K219 Gastro-esophageal reflux disease without esophagitis: Secondary | ICD-10-CM | POA: Diagnosis not present

## 2021-04-01 DIAGNOSIS — I728 Aneurysm of other specified arteries: Secondary | ICD-10-CM | POA: Diagnosis not present

## 2021-04-01 DIAGNOSIS — G8929 Other chronic pain: Secondary | ICD-10-CM | POA: Diagnosis not present

## 2021-04-01 DIAGNOSIS — Z8616 Personal history of COVID-19: Secondary | ICD-10-CM | POA: Diagnosis not present

## 2021-04-01 DIAGNOSIS — R059 Cough, unspecified: Secondary | ICD-10-CM | POA: Diagnosis not present

## 2021-04-01 DIAGNOSIS — M545 Low back pain, unspecified: Secondary | ICD-10-CM | POA: Diagnosis not present

## 2021-04-01 DIAGNOSIS — E079 Disorder of thyroid, unspecified: Secondary | ICD-10-CM | POA: Diagnosis not present

## 2021-04-01 DIAGNOSIS — F17210 Nicotine dependence, cigarettes, uncomplicated: Secondary | ICD-10-CM | POA: Diagnosis not present

## 2021-04-01 DIAGNOSIS — R053 Chronic cough: Secondary | ICD-10-CM | POA: Diagnosis not present

## 2021-04-01 DIAGNOSIS — I10 Essential (primary) hypertension: Secondary | ICD-10-CM | POA: Diagnosis not present

## 2021-04-01 DIAGNOSIS — F32A Depression, unspecified: Secondary | ICD-10-CM | POA: Diagnosis not present

## 2021-04-01 DIAGNOSIS — Z720 Tobacco use: Secondary | ICD-10-CM | POA: Diagnosis not present

## 2021-04-01 DIAGNOSIS — E559 Vitamin D deficiency, unspecified: Secondary | ICD-10-CM | POA: Diagnosis not present

## 2021-04-02 DIAGNOSIS — C44319 Basal cell carcinoma of skin of other parts of face: Secondary | ICD-10-CM | POA: Diagnosis not present

## 2021-04-28 DIAGNOSIS — H0288B Meibomian gland dysfunction left eye, upper and lower eyelids: Secondary | ICD-10-CM | POA: Diagnosis not present

## 2021-04-28 DIAGNOSIS — H0288A Meibomian gland dysfunction right eye, upper and lower eyelids: Secondary | ICD-10-CM | POA: Diagnosis not present

## 2021-04-28 DIAGNOSIS — H16223 Keratoconjunctivitis sicca, not specified as Sjogren's, bilateral: Secondary | ICD-10-CM | POA: Diagnosis not present

## 2021-04-30 DIAGNOSIS — K219 Gastro-esophageal reflux disease without esophagitis: Secondary | ICD-10-CM | POA: Diagnosis not present

## 2021-04-30 DIAGNOSIS — R140 Abdominal distension (gaseous): Secondary | ICD-10-CM | POA: Diagnosis not present

## 2021-07-09 DIAGNOSIS — I10 Essential (primary) hypertension: Secondary | ICD-10-CM | POA: Diagnosis not present

## 2021-07-09 DIAGNOSIS — E079 Disorder of thyroid, unspecified: Secondary | ICD-10-CM | POA: Diagnosis not present

## 2021-07-09 DIAGNOSIS — E559 Vitamin D deficiency, unspecified: Secondary | ICD-10-CM | POA: Diagnosis not present

## 2021-07-09 DIAGNOSIS — Z79899 Other long term (current) drug therapy: Secondary | ICD-10-CM | POA: Diagnosis not present

## 2021-07-16 DIAGNOSIS — F32A Depression, unspecified: Secondary | ICD-10-CM | POA: Diagnosis not present

## 2021-07-16 DIAGNOSIS — Z0001 Encounter for general adult medical examination with abnormal findings: Secondary | ICD-10-CM | POA: Diagnosis not present

## 2021-07-16 DIAGNOSIS — F17210 Nicotine dependence, cigarettes, uncomplicated: Secondary | ICD-10-CM | POA: Diagnosis not present

## 2021-07-16 DIAGNOSIS — I10 Essential (primary) hypertension: Secondary | ICD-10-CM | POA: Diagnosis not present

## 2021-07-16 DIAGNOSIS — K219 Gastro-esophageal reflux disease without esophagitis: Secondary | ICD-10-CM | POA: Diagnosis not present

## 2021-07-16 DIAGNOSIS — E079 Disorder of thyroid, unspecified: Secondary | ICD-10-CM | POA: Diagnosis not present

## 2021-07-16 DIAGNOSIS — J449 Chronic obstructive pulmonary disease, unspecified: Secondary | ICD-10-CM | POA: Diagnosis not present

## 2021-07-16 DIAGNOSIS — Z23 Encounter for immunization: Secondary | ICD-10-CM | POA: Diagnosis not present

## 2021-07-25 ENCOUNTER — Encounter: Payer: Self-pay | Admitting: Internal Medicine

## 2021-07-28 ENCOUNTER — Ambulatory Visit: Payer: PPO | Admitting: Anesthesiology

## 2021-07-28 ENCOUNTER — Encounter
Admission: RE | Disposition: A | Discharge status: Home / Self Care | Payer: Self-pay | Source: Ambulatory Visit | Attending: Gastroenterology

## 2021-07-28 ENCOUNTER — Encounter: Payer: Self-pay | Admitting: Internal Medicine

## 2021-07-28 ENCOUNTER — Ambulatory Visit
Admission: RE | Admit: 2021-07-28 | Discharge: 2021-07-28 | Disposition: A | Discharge status: Home / Self Care | Payer: PPO | Source: Ambulatory Visit | Attending: Gastroenterology | Admitting: Gastroenterology

## 2021-07-28 DIAGNOSIS — Z881 Allergy status to other antibiotic agents status: Secondary | ICD-10-CM | POA: Insufficient documentation

## 2021-07-28 DIAGNOSIS — Z8379 Family history of other diseases of the digestive system: Secondary | ICD-10-CM | POA: Diagnosis not present

## 2021-07-28 DIAGNOSIS — Z79899 Other long term (current) drug therapy: Secondary | ICD-10-CM | POA: Insufficient documentation

## 2021-07-28 DIAGNOSIS — Z7951 Long term (current) use of inhaled steroids: Secondary | ICD-10-CM | POA: Diagnosis not present

## 2021-07-28 DIAGNOSIS — K449 Diaphragmatic hernia without obstruction or gangrene: Secondary | ICD-10-CM | POA: Insufficient documentation

## 2021-07-28 DIAGNOSIS — K219 Gastro-esophageal reflux disease without esophagitis: Secondary | ICD-10-CM | POA: Insufficient documentation

## 2021-07-28 DIAGNOSIS — Z880 Allergy status to penicillin: Secondary | ICD-10-CM | POA: Diagnosis not present

## 2021-07-28 DIAGNOSIS — R1310 Dysphagia, unspecified: Secondary | ICD-10-CM | POA: Insufficient documentation

## 2021-07-28 DIAGNOSIS — F419 Anxiety disorder, unspecified: Secondary | ICD-10-CM | POA: Diagnosis not present

## 2021-07-28 DIAGNOSIS — R140 Abdominal distension (gaseous): Secondary | ICD-10-CM | POA: Insufficient documentation

## 2021-07-28 DIAGNOSIS — K2970 Gastritis, unspecified, without bleeding: Secondary | ICD-10-CM | POA: Diagnosis not present

## 2021-07-28 DIAGNOSIS — Z800 Family history of malignant neoplasm of digestive organs: Secondary | ICD-10-CM | POA: Diagnosis not present

## 2021-07-28 DIAGNOSIS — K5900 Constipation, unspecified: Secondary | ICD-10-CM | POA: Diagnosis not present

## 2021-07-28 HISTORY — PX: ESOPHAGOGASTRODUODENOSCOPY (EGD) WITH PROPOFOL: SHX5813

## 2021-07-28 SURGERY — ESOPHAGOGASTRODUODENOSCOPY (EGD) WITH PROPOFOL
Anesthesia: Monitor Anesthesia Care

## 2021-07-28 MED ORDER — PROPOFOL 500 MG/50ML IV EMUL
INTRAVENOUS | Status: AC
  Filled 2021-07-28: qty 50

## 2021-07-28 MED ORDER — LIDOCAINE HCL (CARDIAC) PF 100 MG/5ML IV SOSY
PREFILLED_SYRINGE | INTRAVENOUS | Status: DC | PRN
  Administered 2021-07-28: 40 mg via INTRAVENOUS

## 2021-07-28 MED ORDER — SODIUM CHLORIDE 0.9 % IV SOLN
INTRAVENOUS | Status: DC
  Administered 2021-07-28: 1000 mL via INTRAVENOUS

## 2021-07-28 MED ORDER — DEXMEDETOMIDINE (PRECEDEX) IN NS 20 MCG/5ML (4 MCG/ML) IV SYRINGE
PREFILLED_SYRINGE | INTRAVENOUS | Status: DC | PRN
  Administered 2021-07-28: 8 ug via INTRAVENOUS

## 2021-07-28 MED ORDER — PROPOFOL 500 MG/50ML IV EMUL
INTRAVENOUS | Status: DC | PRN
  Administered 2021-07-28: 175 ug/kg/min via INTRAVENOUS

## 2021-07-28 MED ORDER — PROPOFOL 10 MG/ML IV BOLUS
INTRAVENOUS | Status: DC | PRN
  Administered 2021-07-28: 80 mg via INTRAVENOUS

## 2021-07-28 NOTE — Anesthesia Preprocedure Evaluation (Signed)
Anesthesia Evaluation  Patient identified by MRN, date of birth, ID band Patient awake    Reviewed: Allergy & Precautions, NPO status , Patient's Chart, lab work & pertinent test results  History of Anesthesia Complications Negative for: history of anesthetic complications  Airway Mallampati: II  TM Distance: >3 FB Neck ROM: Full    Dental no notable dental hx. (+) Partial Lower   Pulmonary COPD (At baseline),  COPD inhaler, Current Smoker and Patient abstained from smoking.,    Pulmonary exam normal breath sounds clear to auscultation       Cardiovascular Exercise Tolerance: Good METS: 3 - Mets hypertension, negative cardio ROS Normal cardiovascular exam Rhythm:Regular Rate:Normal     Neuro/Psych PSYCHIATRIC DISORDERS Anxiety Depression    GI/Hepatic hiatal hernia, GERD  ,(+) Hepatitis - (Treated), C  Endo/Other  negative endocrine ROS  Renal/GU negative Renal ROS  negative genitourinary   Musculoskeletal negative musculoskeletal ROS (+)   Abdominal   Peds  Hematology negative hematology ROS (+) anemia ,   Anesthesia Other Findings   Reproductive/Obstetrics negative OB ROS                             Anesthesia Physical Anesthesia Plan  ASA: 2  Anesthesia Plan: MAC   Post-op Pain Management:    Induction: Intravenous  PONV Risk Score and Plan: 1  Airway Management Planned: Natural Airway and Nasal Cannula  Additional Equipment:   Intra-op Plan:   Post-operative Plan:   Informed Consent: I have reviewed the patients History and Physical, chart, labs and discussed the procedure including the risks, benefits and alternatives for the proposed anesthesia with the patient or authorized representative who has indicated his/her understanding and acceptance.     Dental Advisory Given  Plan Discussed with: Anesthesiologist, CRNA and Surgeon  Anesthesia Plan Comments: (Patient  consented for risks of anesthesia including but not limited to:  - adverse reactions to medications - damage to eyes, teeth, lips or other oral mucosa - nerve damage due to positioning  - sore throat or hoarseness - Damage to heart, brain, nerves, lungs, other parts of body or loss of life  Patient voiced understanding.)        Anesthesia Quick Evaluation

## 2021-07-28 NOTE — H&P (Signed)
[facility] Pre-Procedure H&P   Patient ID: Tina [facility] is a 70 y.o. female.  Gastroenterology Provider: Annamaria Helling, DO  Referring Provider: Laurine Blazer, PA PCP: Baxter Hire, MD  Date: 07/28/2021  HPI Tina Bradley is a 70 y.o. female who presents today for Esophagogastroduodenoscopy for gas, bloating, gerd.  C/o reflux w/o n/v/dysphagia/odynophagia. Notes gas and bloating with constipation. Mild epigastric pain. Uses ibuprofen, caffeine, tobacco products. These sx have improved since stopping ibuprofen and starting dexilant with her last office visit.  H/o splenic artery aneurysm.  2017 egd with gastritis negative for HP  FHX- fr cirrhosis, sister- colorectal cancer   Past Medical History:  Diagnosis Date   Anemia    Aneurysm of splenic artery (HCC)    Aneurysm of splenic artery (HCC)    Anxiety    Back pain    Cancer ([place])    skin cancer 2018   COPD (chronic obstructive pulmonary disease) (HCC)    Depression    Gastritis    GERD (gastroesophageal reflux disease)    Gout    Hepatitis 2013   c w/o coma   Hip fracture (HCC)    History of hiatal hernia    History of kidney stones    Hypertension    IBS (irritable bowel syndrome)    Liver disease    Lumbago    Multinodular goiter    Neck fracture (HCC)    Splenic artery aneurysm [facility])     Past Surgical History:  Procedure Laterality Date   APPENDECTOMY     BREAST BIOPSY Right 08/07/2020   stereo bx, distortion, coil marker, BENIGN MAMMARY PARENCHYMA WITH DENSE STROMAL FIBROSIS, FIBROCYSTIC AND SCLEROSING ADENOSIS.  SUGGESTIVE OF SCLEROSED INTRADUCTAL PAPILLOMA   BREAST BIOPSY WITH RADIO FREQUENCY LOCALIZER Right 08/26/2020   Procedure: Excisional BREAST BIOPSY WITH RADIO FREQUENCY LOCALIZER;  Surgeon: Herbert Pun, MD;  Location: [facility] ORS;  Service: General;  Laterality: Right;   BREAST CYST ASPIRATION     CARPAL TUNNEL RELEASE Right    CHOLECYSTECTOMY  1978    COLONOSCOPY N/A 08/07/2016   Procedure: COLONOSCOPY;  Surgeon: Lollie Sails, MD;  Location: [facility] ENDOSCOPY;  Service: Endoscopy;  Laterality: N/A;   COLONOSCOPY WITH PROPOFOL N/A 07/07/2018   Procedure: COLONOSCOPY WITH PROPOFOL;  Surgeon: Lollie Sails, MD;  Location: [facility] ENDOSCOPY;  Service: Endoscopy;  Laterality: N/A;   ESOPHAGOGASTRODUODENOSCOPY (EGD) WITH PROPOFOL N/A 03/06/2016   Procedure: ESOPHAGOGASTRODUODENOSCOPY (EGD) WITH PROPOFOL;  Surgeon: Hulen Luster, MD;  Location: [facility] ENDOSCOPY;  Service: Gastroenterology;  Laterality: N/A;   HERNIA REPAIR Right 1950's   inguinal   LUMBAR LAMINECTOMY/DECOMPRESSION MICRODISCECTOMY Right 12/15/2017   Procedure: LUMBAR LAMINECTOMY/DECOMPRESSION MICRODISCECTOMY 1 LEVEL L4-5;  Surgeon: Meade Maw, MD;  Location: [facility] ORS;  Service: Neurosurgery;  Laterality: Right;   THYROID SURGERY Right 2015   TONSILLECTOMY  1965   TUBAL LIGATION      Family History Fr-cirrhosis, sister- colorectal cancer No h/o GI disease or malignancy  Review of Systems  Constitutional:  Negative for activity change, appetite change, fatigue, fever and unexpected weight change.  HENT:  Negative for trouble swallowing and voice change.   Respiratory:  Negative for shortness of breath and wheezing.   Cardiovascular:  Negative for chest pain and palpitations.  Gastrointestinal:  Positive for abdominal distention and abdominal pain (epigastric). Negative for anal bleeding, blood in stool, constipation, diarrhea, nausea, rectal pain and vomiting.       Gas bloating, gerd  Musculoskeletal:  Negative for arthralgias  and myalgias.  Skin:  Negative for color change and pallor.  Neurological:  Negative for dizziness, syncope and weakness.  Psychiatric/Behavioral:  Negative for confusion.   All other systems reviewed and are negative.   Medications No current facility-administered medications on file prior to encounter.   Current Outpatient Medications on File  Prior to Encounter  Medication Sig Dispense Refill   albuterol (ACCUNEB) 0.63 MG/3ML nebulizer solution Take 1 ampule by nebulization every 6 (six) hours as needed for wheezing.     albuterol (VENTOLIN HFA) 108 (90 Base) MCG/ACT inhaler Inhale 2 puffs into the lungs every 6 (six) hours as needed for wheezing or shortness of breath.     ALPRAZolam (XANAX) 0.5 MG tablet Take 0.25-0.5 mg by mouth 2 (two) times daily as needed for anxiety.      budesonide-formoterol (SYMBICORT) 160-4.5 MCG/ACT inhaler Inhale 2 puffs into the lungs 2 (two) times daily as needed (wheezing or shortness of breath).      clobetasol (TEMOVATE) 0.05 % external solution Apply 1 application topically 2 (two) times daily.     dexlansoprazole (DEXILANT) 60 MG capsule Take 60 mg by mouth daily.     dicyclomine (BENTYL) 10 MG capsule Take 10 mg by mouth 4 (four) times daily -  before meals and at bedtime.     amitriptyline (ELAVIL) 50 MG tablet Take 50 mg by mouth at bedtime.      metoprolol succinate (TOPROL-XL) 25 MG 24 hr tablet Take 25 mg by mouth daily.     ondansetron (ZOFRAN-ODT) 4 MG disintegrating tablet Take 4 mg by mouth every 8 (eight) hours as needed for nausea or vomiting.  (Patient not taking: Reported on 08/26/2020)     pantoprazole (PROTONIX) 40 MG tablet Take 80 mg by mouth daily.      vitamin B-12 (CYANOCOBALAMIN) 1000 MCG tablet Take 1,000 mcg by mouth daily.     Vitamin D3 (VITAMIN D) 25 MCG tablet Take 1,000 Units by mouth daily.       Pertinent medications related to GI and procedure were reviewed by me with the patient prior to the procedure   Current Facility-Administered Medications:    0.9 %  sodium chloride infusion, , Intravenous, Continuous, Annamaria Helling, DO, Last Rate: 20 mL/hr at 07/28/21 1206, 1,000 mL at 07/28/21 1206  sodium chloride 1,000 mL (07/28/21 1206)       Allergies  Allergen Reactions   Augmentin [Amoxicillin-Pot Clavulanate] Other (See Comments)    She states upset  stomach and diarrhea. Also blisters in mouth.   Wellbutrin [Bupropion] Other (See Comments)    Bad dreams   Propoxyphene Nausea And Vomiting and Rash   Allergies were reviewed by me prior to the procedure  Objective    Vitals:   07/28/21 1153  BP: (!) 182/76  Pulse: 79  Resp: 16  Temp: 98.5 F (36.9 C)  SpO2: 99%  Weight: 49.4 kg  Height: 5\' 3"  (1.6 m)     Physical Exam Vitals reviewed.  Constitutional:      General: She is not in acute distress.    Appearance: Normal appearance. She is not ill-appearing, toxic-appearing or diaphoretic.  HENT:     Head: Normocephalic and atraumatic.     Nose: Nose normal.     Mouth/Throat:     Mouth: Mucous membranes are moist.     Pharynx: Oropharynx is clear.  Eyes:     General: No scleral icterus.    Extraocular Movements: Extraocular movements intact.  Cardiovascular:  Rate and Rhythm: Normal rate and regular rhythm.     Heart sounds: Normal heart sounds. No murmur heard.   No friction rub. No gallop.  Pulmonary:     Effort: Pulmonary effort is normal. No respiratory distress.     Breath sounds: Normal breath sounds. No wheezing, rhonchi or rales.  Abdominal:     General: Abdomen is flat. Bowel sounds are normal. There is no distension.     Palpations: Abdomen is soft.     Tenderness: There is no abdominal tenderness. There is no guarding or rebound.  Musculoskeletal:     Cervical back: Neck supple.     Right lower leg: No edema.     Left lower leg: No edema.  Skin:    General: Skin is warm and dry.     Coloration: Skin is not jaundiced or pale.  Neurological:     General: No focal deficit present.     Mental Status: She is alert and oriented to person, place, and time. Mental status is at baseline.  Psychiatric:        Mood and Affect: Mood normal.        Behavior: Behavior normal.        Thought Content: Thought content normal.        Judgment: Judgment normal.     Assessment:  Tina Bradley is a 70 y.o.  female  who presents today for Esophagogastroduodenoscopy for gas, bloating, gerd.  Plan:  Esophagogastroduodenoscopy with possible intervention today  Esophagogastroduodenoscopy with possible biopsy, control of bleeding, polypectomy, and interventions as necessary has been discussed with the patient/patient representative. Informed consent was obtained from the patient/patient representative after explaining the indication, nature, and risks of the procedure including but not limited to death, bleeding, perforation, missed neoplasm/lesions, cardiorespiratory compromise, and reaction to medications. Opportunity for questions was given and appropriate answers were provided. Patient/patient representative has verbalized understanding is amenable to undergoing the procedure.   Annamaria Helling, DO  [facility]  Portions of the record may have been created with voice recognition software. Occasional wrong-word or 'sound-a-like' substitutions may have occurred due to the inherent limitations of voice recognition software.  Read the chart carefully and recognize, using context, where substitutions may have occurred.

## 2021-07-28 NOTE — Interval H&P Note (Signed)
History and Physical Interval Note: Preprocedure H&P from 07/28/21  was reviewed and there was no interval change after seeing and examining the patient.  Written consent was obtained from the patient after discussion of risks, benefits, and alternatives. Patient has consented to proceed with Esophagogastroduodenoscopy with possible intervention   07/28/2021 1:02 PM  Tina Bradley  has presented today for surgery, with the diagnosis of GERD K21.9.  The various methods of treatment have been discussed with the patient and family. After consideration of risks, benefits and other options for treatment, the patient has consented to  Procedure(s): ESOPHAGOGASTRODUODENOSCOPY (EGD) WITH PROPOFOL (N/A) as a surgical intervention.  The patient's history has been reviewed, patient examined, no change in status, stable for surgery.  I have reviewed the patient's chart and labs.  Questions were answered to the patient's satisfaction.     Annamaria Helling

## 2021-07-28 NOTE — Op Note (Signed)
[facility] Patient Name: Tina Bradley Procedure Date: 07/28/2021 1:00 PM MRN: [id number] Account #: [id number] Date of Birth: March 24, 1951 Admit Type: Outpatient Age: 70 Room: [facility] ENDO ROOM 2 Gender: Female Note Status: Finalized Instrument Name: Upper Endoscope [id number] Procedure:             Upper GI endoscopy Indications:           Abdominal bloating Providers:             Rueben Bash, DO Referring MD:          Baxter Hire, MD (Referring MD) Medicines:             Monitored Anesthesia Care Complications:         No immediate complications. Estimated blood loss:                         Minimal. Procedure:             Pre-Anesthesia Assessment:                        - Prior to the procedure, a History and Physical was                         performed, and patient medications and allergies were                         reviewed. The patient is competent. The risks and                         benefits of the procedure and the sedation options and                         risks were discussed with the patient. All questions                         were answered and informed consent was obtained.                         Patient identification and proposed procedure were                         verified by the physician, the nurse, the anesthetist                         and the technician in the endoscopy suite. Mental                         Status Examination: alert and oriented. Airway                         Examination: normal oropharyngeal airway and neck                         mobility. Respiratory Examination: clear to                         auscultation. CV Examination: RRR, no murmurs, no S3  or S4. Prophylactic Antibiotics: The patient does not                         require prophylactic antibiotics. Prior                         Anticoagulants: The patient has taken no previous                          anticoagulant or antiplatelet agents. ASA Grade                         Assessment: II - A patient with mild systemic disease.                         After reviewing the risks and benefits, the patient                         was deemed in satisfactory condition to undergo the                         procedure. The anesthesia plan was to use monitored                         anesthesia care (MAC). Immediately prior to                         administration of medications, the patient was                         re-assessed for adequacy to receive sedatives. The                         heart rate, respiratory rate, oxygen saturations,                         blood pressure, adequacy of pulmonary ventilation, and                         response to care were monitored throughout the                         procedure. The physical status of the patient was                         re-assessed after the procedure.                        After obtaining informed consent, the endoscope was                         passed under direct vision. Throughout the procedure,                         the patient's blood pressure, pulse, and oxygen                         saturations were monitored continuously. The Endoscope  was introduced through the mouth, and advanced to the                         second part of duodenum. The upper GI endoscopy was                         accomplished without difficulty. The patient tolerated                         the procedure well. Findings:      The duodenal bulb, first portion of the duodenum and second portion of       the duodenum were normal. Biopsies for histology were taken with a cold       forceps for evaluation of celiac disease. Estimated blood loss was       minimal.      Localized mild inflammation characterized by erythema was found in the       gastric antrum. Biopsies were taken with a cold forceps for Helicobacter        pylori testing. Estimated blood loss was minimal.      A small hiatal hernia was present.      The Z-line was regular and was found 40 cm from the incisors.      Esophagogastric landmarks were identified: the gastroesophageal junction       was found at 40 cm from the incisors.      Normal mucosa was found in the entire esophagus. Estimated blood loss:       none. Impression:            - Normal duodenal bulb, first portion of the duodenum                         and second portion of the duodenum. Biopsied.                        - Gastritis. Biopsied.                        - Small hiatal hernia.                        - Z-line regular, 40 cm from the incisors.                        - Esophagogastric landmarks identified.                        - Normal mucosa was found in the entire esophagus.                        - Suspect aerophagia component for bloating. Recommendation:        - Discharge patient to home.                        - Resume previous diet.                        - Continue present medications.                        - Await pathology results.                        -  Return to referring physician as previously                         scheduled. Procedure Code(s):     --- Professional ---                        [phone], Esophagogastroduodenoscopy, flexible,                         transoral; with biopsy, single or multiple Diagnosis Code(s):     --- Professional ---                        K29.70, Gastritis, unspecified, without bleeding                        K44.9, Diaphragmatic hernia without obstruction or                         gangrene                        R14.0, Abdominal distension (gaseous) CPT copyright 2019 American Medical Association. All rights reserved. The codes documented in this report are preliminary and upon coder review may  be revised to meet current compliance requirements. Attending Participation:      I personally performed the entire  procedure. Volney American, DO Annamaria Helling DO, DO 07/28/2021 1:22:09 PM This report has been signed electronically. Number of Addenda: 0 Note Initiated On: 07/28/2021 1:00 PM Estimated Blood Loss:  Estimated blood loss was minimal.      [facility]

## 2021-07-28 NOTE — Transfer of Care (Signed)
Immediate Anesthesia Transfer of Care Note  Patient: Tina Bradley  Procedure(s) Performed: Procedure(s): ESOPHAGOGASTRODUODENOSCOPY (EGD) WITH PROPOFOL (N/A)  Patient Location: PACU and Endoscopy Unit  Anesthesia Type:General  Level of Consciousness: sedated  Airway & Oxygen Therapy: Patient Spontanous Breathing and Patient connected to nasal cannula oxygen  Post-op Assessment: Report given to RN and Post -op Vital signs reviewed and stable  Post vital signs: Reviewed and stable  Last Vitals:  Vitals:   07/28/21 1328 07/28/21 1338  BP: 140/65 (!) 127/101  Pulse: 64 60  Resp: (!) 23 (!) 21  Temp:    SpO2: 33% 43%    Complications: No apparent anesthesia complications

## 2021-07-28 NOTE — Anesthesia Procedure Notes (Signed)
Date/Time: 07/28/2021 1:05 PM Performed by: Doreen Salvage, CRNA Pre-anesthesia Checklist: Patient identified, Emergency Drugs available, Suction available and Patient being monitored Patient Re-evaluated:Patient Re-evaluated prior to induction Oxygen Delivery Method: Nasal cannula Induction Type: IV induction Dental Injury: Teeth and Oropharynx as per pre-operative assessment  Comments: Nasal cannula with etCO2 monitoring

## 2021-07-29 ENCOUNTER — Encounter: Payer: Self-pay | Admitting: Gastroenterology

## 2021-07-29 LAB — SURGICAL PATHOLOGY

## 2021-08-04 DIAGNOSIS — L821 Other seborrheic keratosis: Secondary | ICD-10-CM | POA: Diagnosis not present

## 2021-08-04 DIAGNOSIS — Z85828 Personal history of other malignant neoplasm of skin: Secondary | ICD-10-CM | POA: Diagnosis not present

## 2021-08-04 DIAGNOSIS — Z872 Personal history of diseases of the skin and subcutaneous tissue: Secondary | ICD-10-CM | POA: Diagnosis not present

## 2021-08-15 NOTE — Anesthesia Postprocedure Evaluation (Signed)
Anesthesia Post Note  Patient: Tina Bradley  Procedure(s) Performed: ESOPHAGOGASTRODUODENOSCOPY (EGD) WITH PROPOFOL  Patient location during evaluation: PACU Anesthesia Type: MAC Level of consciousness: awake and alert Pain management: pain level controlled Vital Signs Assessment: post-procedure vital signs reviewed and stable Respiratory status: spontaneous breathing, nonlabored ventilation, respiratory function stable and patient connected to nasal cannula oxygen Cardiovascular status: stable and blood pressure returned to baseline Postop Assessment: no apparent nausea or vomiting Anesthetic complications: no   No notable events documented.   Last Vitals:  Vitals:   07/28/21 1338 07/28/21 1348  BP: (!) 127/101 (!) 145/77  Pulse: 60 64  Resp: (!) 21 16  Temp:    SpO2: 96% 99%    Last Pain:  Vitals:   07/28/21 1348  PainSc: 0-No pain                 Tonny Bollman

## 2022-01-02 IMAGING — MG MM PLC BREAST LOC DEV 1ST LESION INC*R*
7 series · 7 of 7 positions shown · non-contrast
Comparison: Previous exam(s)

CLINICAL DATA: 69-year-old female presenting for RF tag
localization of the right breast prior to surgical excision.

EXAM:
MAMMOGRAPHIC GUIDED RADIOFREQUENCY DEVICE
LOCALIZATION OF THE RIGHT BREAST

[R CC (1 of 3)]
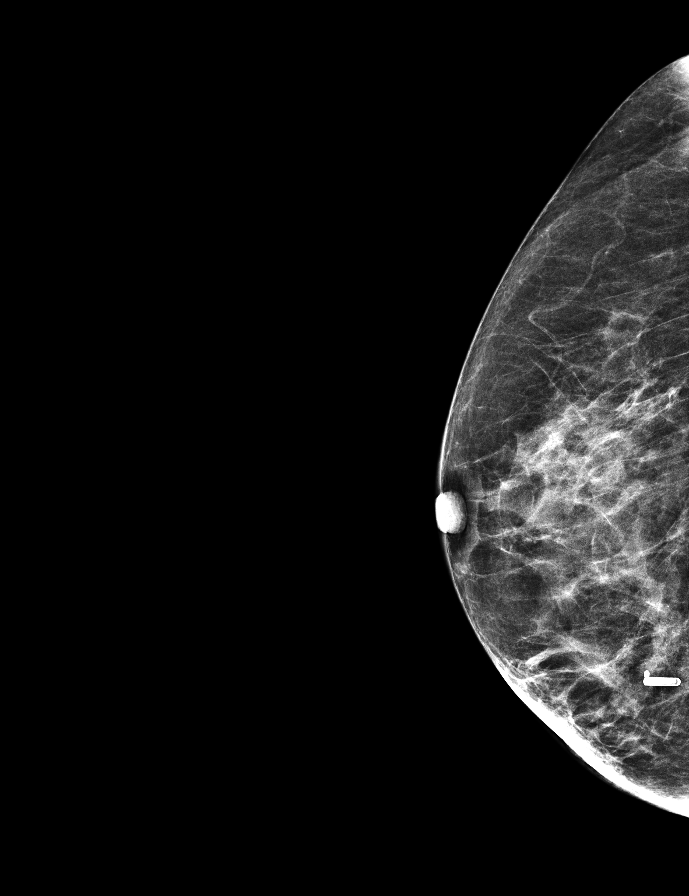

[R ML (1 of 4)]
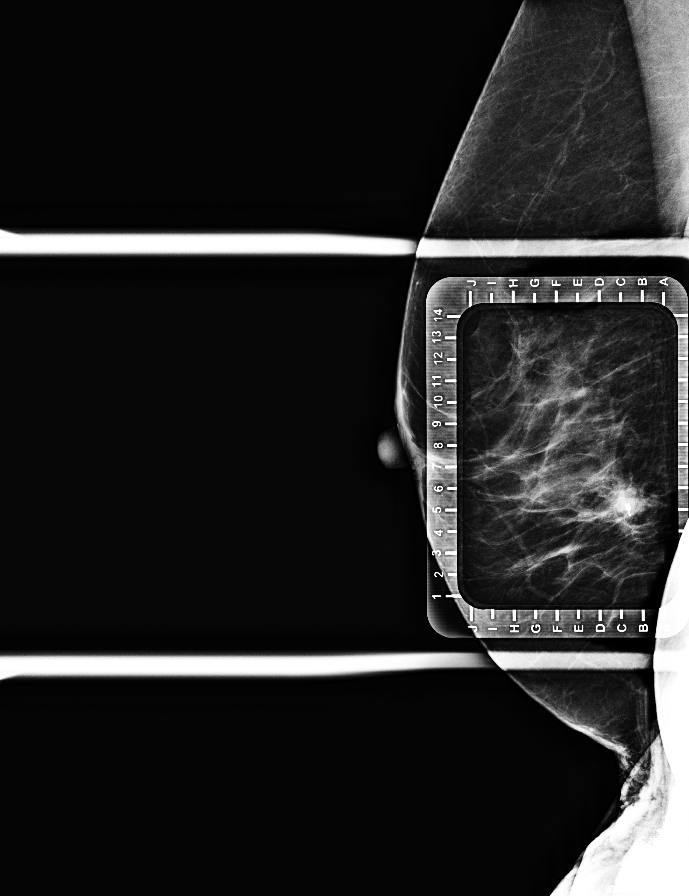

[R ML (2 of 4)]
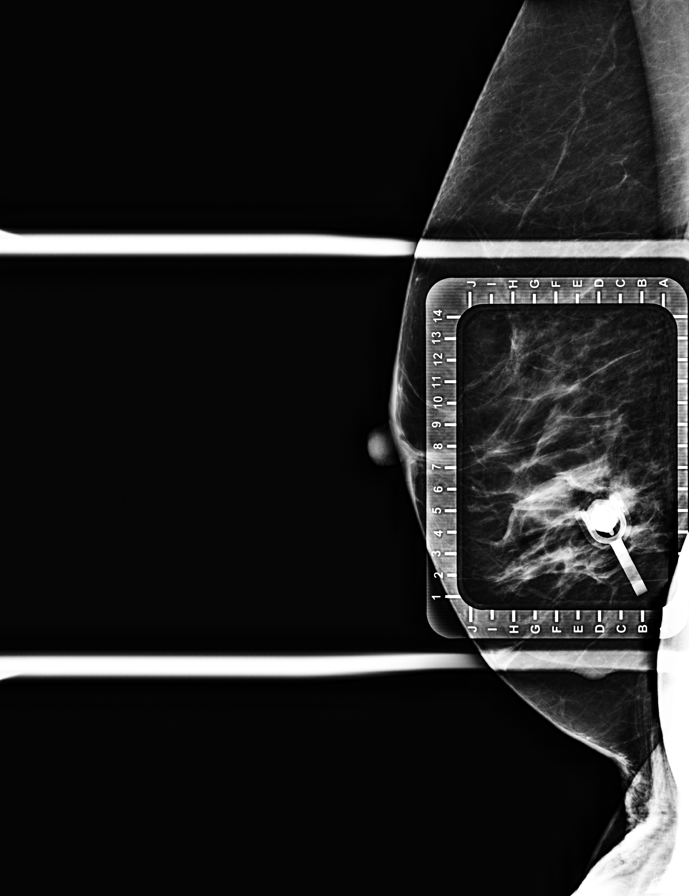

[R ML (3 of 4)]
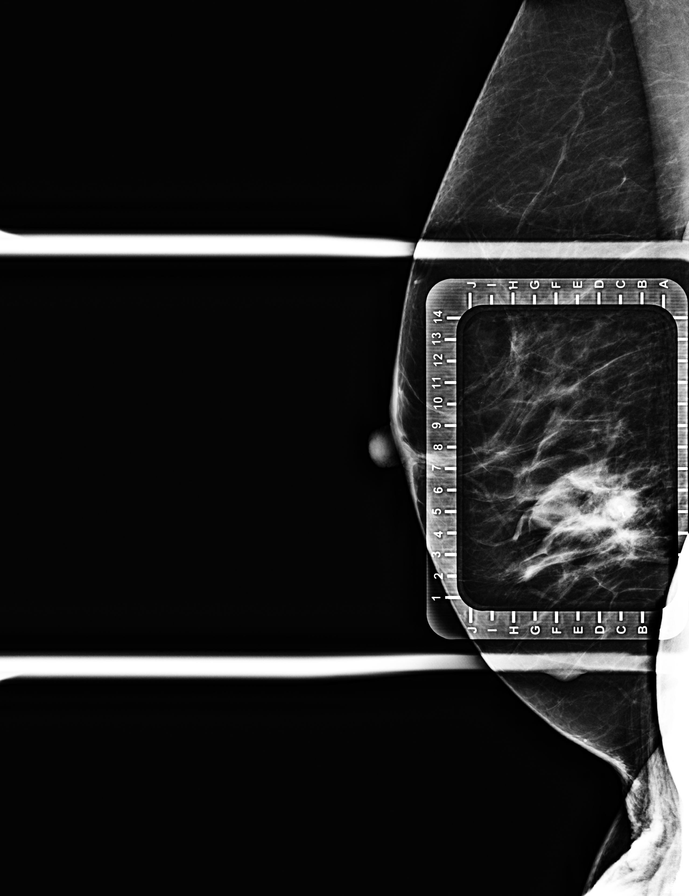

[R ML (4 of 4)]
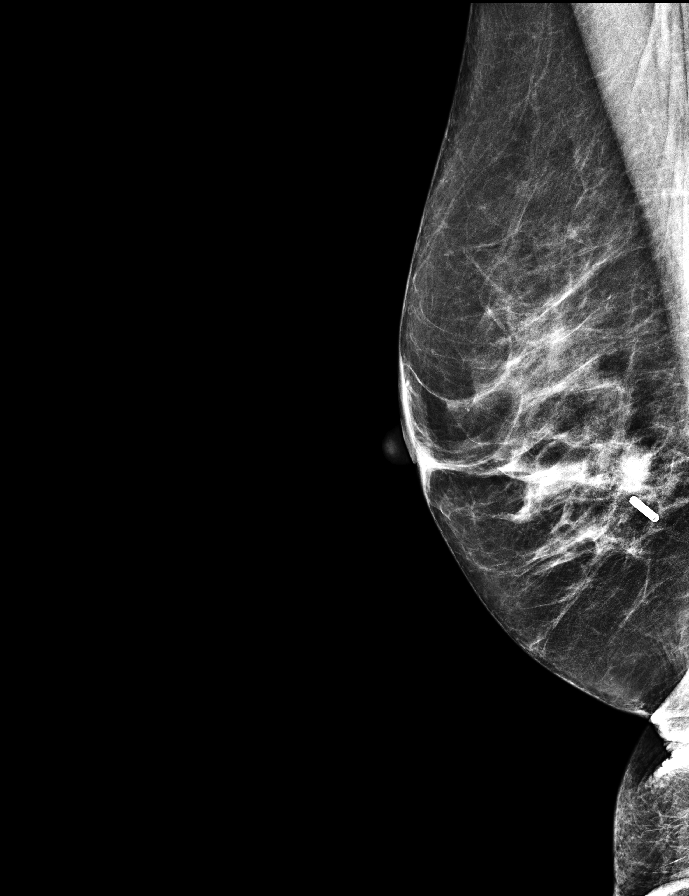

[R CC (2 of 3)]
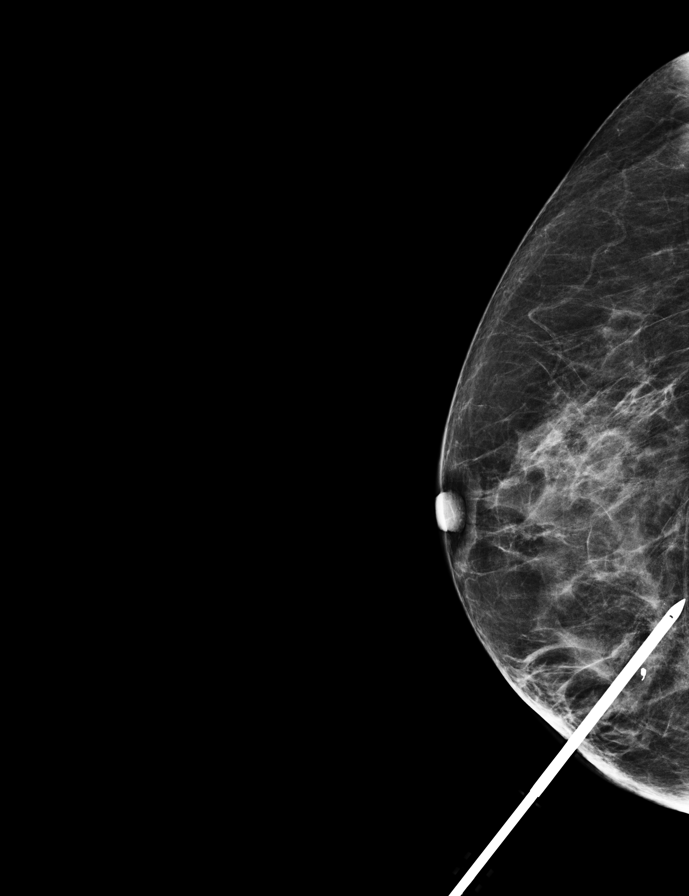

[R CC (3 of 3)]
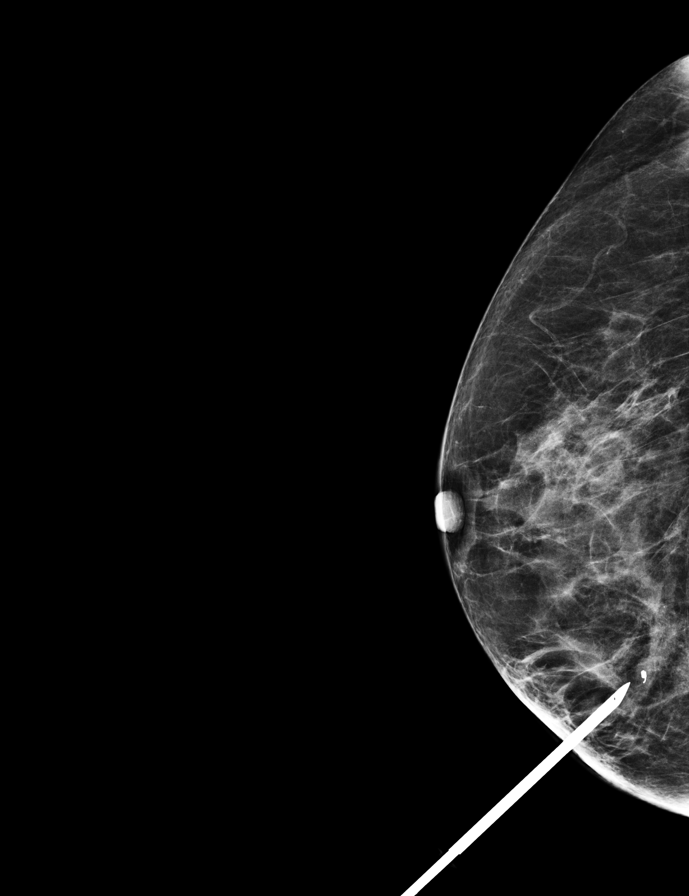

[7 of 7 positions shown; findings below may reference images not displayed]

FINDINGS: Patient presents for radiofrequency device localization prior to
surgical excision of the right breast. I met with the patient and we
discussed the procedure of radiofrequency device localization
including benefits and alternatives. We discussed the high
likelihood of a successful procedure. We discussed the risks of the
procedure including infection, bleeding, tissue injury and further
surgery. Informed, written consent was given.

The usual time-out protocol was performed immediately prior to the
procedure.

Using mammographic guidance, sterile technique, 1% lidocaine as
local anesthesia, a radiofrequency tag was used to localize a coil
shaped clip in the medial right breast using a medial approach.

The follow-up mammogram images confirm that the RF device is in the
expected location and are marked for Dr. Kaki.

Follow-up survey of the patient confirms the presence of the RF
device.

The patient tolerated the procedure well and was released from the
[REDACTED].
IMPRESSION: Radiofrequency device localization of the right breast. No apparent
complications.

## 2023-11-19 ENCOUNTER — Other Ambulatory Visit: Payer: Self-pay | Admitting: Internal Medicine

## 2023-11-19 DIAGNOSIS — Z1231 Encounter for screening mammogram for malignant neoplasm of breast: Secondary | ICD-10-CM

## 2024-06-27 ENCOUNTER — Ambulatory Visit
Admission: RE | Admit: 2024-06-27 | Discharge: 2024-06-27 | Disposition: A | Discharge status: Home / Self Care | Source: Ambulatory Visit | Attending: Internal Medicine | Admitting: Internal Medicine

## 2024-06-27 DIAGNOSIS — Z1231 Encounter for screening mammogram for malignant neoplasm of breast: Secondary | ICD-10-CM | POA: Diagnosis present
